# Patient Record
Sex: Male | Born: 1960
Health system: Southern US, Community
[De-identification: ages and names within clinical notes are randomized; demographics above are authoritative.]

## PROBLEM LIST (undated history)

## (undated) DIAGNOSIS — F419 Anxiety disorder, unspecified: Secondary | ICD-10-CM

## (undated) DIAGNOSIS — R0600 Dyspnea, unspecified: Secondary | ICD-10-CM

## (undated) DIAGNOSIS — G8928 Other chronic postprocedural pain: Secondary | ICD-10-CM

## (undated) DIAGNOSIS — J189 Pneumonia, unspecified organism: Secondary | ICD-10-CM

## (undated) DIAGNOSIS — F32A Depression, unspecified: Secondary | ICD-10-CM

## (undated) DIAGNOSIS — R569 Unspecified convulsions: Secondary | ICD-10-CM

## (undated) DIAGNOSIS — M1990 Unspecified osteoarthritis, unspecified site: Secondary | ICD-10-CM

## (undated) DIAGNOSIS — I2510 Atherosclerotic heart disease of native coronary artery without angina pectoris: Secondary | ICD-10-CM

## (undated) DIAGNOSIS — J449 Chronic obstructive pulmonary disease, unspecified: Secondary | ICD-10-CM

## (undated) HISTORY — DX: Other chronic postprocedural pain: G89.28

## (undated) HISTORY — DX: Chronic obstructive pulmonary disease, unspecified: J44.9

## (undated) HISTORY — PX: OTHER SURGICAL HISTORY: SHX169

## (undated) HISTORY — DX: Unspecified osteoarthritis, unspecified site: M19.90

## (undated) HISTORY — PX: ANKLE SURGERY: SHX546

## (undated) HISTORY — PX: NASAL FRACTURE SURGERY: SHX718

## (undated) HISTORY — DX: Unspecified convulsions: R56.9

## (undated) HISTORY — PX: COLONOSCOPY W/ POLYPECTOMY: SHX1380

## (undated) HISTORY — PX: VASECTOMY: SHX75

---

## 1997-12-24 ENCOUNTER — Emergency Department (HOSPITAL_COMMUNITY): Admission: EM | Admit: 1997-12-24 | Discharge: 1997-12-24 | Payer: Self-pay | Admitting: Emergency Medicine

## 1997-12-25 ENCOUNTER — Encounter: Payer: Self-pay | Admitting: Emergency Medicine

## 1998-01-03 ENCOUNTER — Inpatient Hospital Stay (HOSPITAL_COMMUNITY): Admission: EM | Admit: 1998-01-03 | Discharge: 1998-01-07 | Payer: Self-pay | Admitting: Emergency Medicine

## 1999-05-12 ENCOUNTER — Encounter: Payer: Self-pay | Admitting: Emergency Medicine

## 1999-05-12 ENCOUNTER — Emergency Department (HOSPITAL_COMMUNITY): Admission: EM | Admit: 1999-05-12 | Discharge: 1999-05-12 | Payer: Self-pay | Admitting: Emergency Medicine

## 1999-07-16 ENCOUNTER — Ambulatory Visit (HOSPITAL_COMMUNITY): Admission: RE | Admit: 1999-07-16 | Discharge: 1999-07-16 | Payer: Self-pay | Admitting: Gastroenterology

## 1999-12-21 ENCOUNTER — Emergency Department (HOSPITAL_COMMUNITY): Admission: EM | Admit: 1999-12-21 | Discharge: 1999-12-21 | Payer: Self-pay

## 2000-01-20 ENCOUNTER — Encounter: Payer: Self-pay | Admitting: Orthopedic Surgery

## 2000-01-29 ENCOUNTER — Ambulatory Visit (HOSPITAL_COMMUNITY): Admission: RE | Admit: 2000-01-29 | Discharge: 2000-01-29 | Payer: Self-pay | Admitting: Orthopedic Surgery

## 2004-05-02 ENCOUNTER — Ambulatory Visit: Payer: Self-pay | Admitting: Family Medicine

## 2004-12-22 ENCOUNTER — Ambulatory Visit: Payer: Self-pay | Admitting: Pain Medicine

## 2005-06-09 ENCOUNTER — Ambulatory Visit: Payer: Self-pay | Admitting: Urology

## 2006-04-14 ENCOUNTER — Emergency Department: Payer: Self-pay | Admitting: Emergency Medicine

## 2010-01-23 ENCOUNTER — Emergency Department: Payer: Self-pay | Admitting: Emergency Medicine

## 2010-05-01 ENCOUNTER — Ambulatory Visit: Payer: Self-pay | Admitting: Internal Medicine

## 2010-08-03 ENCOUNTER — Other Ambulatory Visit: Payer: Self-pay | Admitting: Neurosurgery

## 2010-08-03 DIAGNOSIS — M549 Dorsalgia, unspecified: Secondary | ICD-10-CM

## 2010-08-03 DIAGNOSIS — M542 Cervicalgia: Secondary | ICD-10-CM

## 2010-08-14 ENCOUNTER — Other Ambulatory Visit: Payer: Self-pay

## 2010-10-08 ENCOUNTER — Encounter: Payer: Self-pay | Admitting: Internal Medicine

## 2010-10-23 ENCOUNTER — Other Ambulatory Visit: Payer: Self-pay | Admitting: *Deleted

## 2010-10-23 NOTE — Telephone Encounter (Signed)
Received refill request from [facility] for prednisone 10mg  6 day pack. Attemped to reach pt at number listed in system [phone] and was told I had the wrong number. Number was deleted from record. Spoke to [place] at [place] and advised her to have pt call our office as refill is denied. Pt may need appt if he needs refill of dosepak.

## 2010-10-27 ENCOUNTER — Telehealth: Payer: Self-pay | Admitting: Internal Medicine

## 2010-10-30 NOTE — Telephone Encounter (Signed)
Opened in error

## 2010-10-31 ENCOUNTER — Other Ambulatory Visit: Payer: Self-pay | Admitting: Internal Medicine

## 2010-10-31 NOTE — Telephone Encounter (Signed)
Opened in error

## 2010-11-11 ENCOUNTER — Telehealth: Payer: Self-pay | Admitting: *Deleted

## 2010-11-11 NOTE — Telephone Encounter (Signed)
Patient called stating that he needs you to call him in some Prednisone for his back and neck. Patient states that he is having a flare up with his back and he was told to call when he needed a refill on Prednisone. Patient stated that he has been treated with this in the past.. Patient stated that this has been going on for 2-3 weeks.  Patient was informed that Dr. Dan Humphreys is out of the office this afternoon. Patient stated that it would be okay for message to wait.  Pharmacy-[facility]/[address].

## 2010-11-12 NOTE — Telephone Encounter (Signed)
Before I call him in meds, he should be seen.

## 2010-11-13 NOTE — Telephone Encounter (Signed)
Patient notified. He will call back for appt.

## 2010-12-18 ENCOUNTER — Ambulatory Visit (INDEPENDENT_AMBULATORY_CARE_PROVIDER_SITE_OTHER): Payer: BC Managed Care – PPO | Admitting: Internal Medicine

## 2010-12-18 ENCOUNTER — Encounter: Payer: Self-pay | Admitting: Internal Medicine

## 2010-12-18 VITALS — BP 132/86 | HR 71 | Temp 98.2°F | Resp 16 | Ht 72.0 in | Wt 169.0 lb

## 2010-12-18 DIAGNOSIS — J40 Bronchitis, not specified as acute or chronic: Secondary | ICD-10-CM

## 2010-12-18 DIAGNOSIS — G894 Chronic pain syndrome: Secondary | ICD-10-CM | POA: Insufficient documentation

## 2010-12-18 DIAGNOSIS — E291 Testicular hypofunction: Secondary | ICD-10-CM

## 2010-12-18 DIAGNOSIS — G8929 Other chronic pain: Secondary | ICD-10-CM | POA: Insufficient documentation

## 2010-12-18 MED ORDER — CYCLOBENZAPRINE HCL 5 MG PO TABS: 5.0000 mg | ORAL_TABLET | Freq: Three times a day (TID) | ORAL | Status: DC | PRN

## 2010-12-18 MED ORDER — TESTOSTERONE CYPIONATE 200 MG/ML IM SOLN: 80.0000 mg | INTRAMUSCULAR | Status: DC

## 2010-12-18 MED ORDER — AZITHROMYCIN 250 MG PO TABS: ORAL_TABLET | ORAL | Status: AC

## 2010-12-18 MED ORDER — GUAIFENESIN-CODEINE 100-10 MG/5ML PO SYRP: 5.0000 mL | ORAL_SOLUTION | Freq: Three times a day (TID) | ORAL | Status: AC | PRN

## 2010-12-18 MED ORDER — ALFUZOSIN HCL ER 10 MG PO TB24: 10.0000 mg | ORAL_TABLET | Freq: Every day | ORAL | Status: DC

## 2010-12-18 NOTE — Progress Notes (Signed)
Subjective:    Patient ID: Jared Farley, male    DOB: 06/20/1960, 50 y.o.   MRN: [id number]  HPI Jared Farley is a 50 year old male with a history of chronic pain and degenerative disease of the cervical and lumbar spine who presents for followup. His primary concern today is a recent episode of cough. He reports this began approximately 2 weeks ago. He notes that several coworkers have been ill with similar symptoms. He describes a dry hacking cough which persisted most of the day. He notes some per human nasal drainage. He reports some chills but no fever. He denies any sore throat. He denies any shortness of breath or wheezing. He has been using over-the-counter cold medications with no improvement.  In regard to his chronic pain, he reports that his pain management physician recently changed his dose of morphine secondary to worsening pain. He notes some improvement with this. He continues to have chronic testicular pain as well as pain in his cervical and lumbar spine. At this point, he has opted not to pursue surgical intervention to his spine to help with pain.  Outpatient Encounter Prescriptions as of 12/18/2010  Medication Sig Dispense Refill  . alfuzosin (UROXATRAL) 10 MG 24 hr tablet Take 1 tablet (10 mg total) by mouth daily.  30 tablet  6  . cyclobenzaprine (FLEXERIL) 5 MG tablet Take 1 tablet (5 mg total) by mouth 3 (three) times daily as needed.  90 tablet  4  . HYDROcodone-acetaminophen (LORTAB) 10-500 MG per tablet Take 1 tablet by mouth every 6 (six) hours as needed.        Marland Kitchen morphine (AVINZA) 60 MG 24 hr capsule Take 60 mg by mouth 2 (two) times daily.        Marland Kitchen testosterone cypionate (DEPOTESTOTERONE CYPIONATE) 200 MG/ML injection Inject 0.4 mLs (80 mg total) into the muscle once a week. 1/4 ml every week.   10 mL  4    Review of Systems  Constitutional: Positive for chills. Negative for fever, activity change, appetite change, fatigue and unexpected weight change.  HENT:  Positive for congestion, neck pain and postnasal drip. Negative for ear pain, sore throat, trouble swallowing and ear discharge.   Eyes: Negative for visual disturbance.  Respiratory: Positive for cough. Negative for shortness of breath.   Cardiovascular: Negative for chest pain, palpitations and leg swelling.  Gastrointestinal: Negative for abdominal pain and abdominal distention.  Genitourinary: Negative for dysuria, urgency and difficulty urinating.  Musculoskeletal: Positive for myalgias, back pain and arthralgias. Negative for gait problem.  Skin: Negative for color change and rash.  Hematological: Negative for adenopathy.  Psychiatric/Behavioral: Negative for sleep disturbance and dysphoric mood. The patient is not nervous/anxious.    BP 132/86  Pulse 71  Temp(Src) 98.2 F (36.8 C) (Oral)  Resp 16  Ht 6' (1.829 m)  Wt 169 lb (76.658 kg)  BMI 22.92 kg/m2  SpO2 98%     Objective:   Physical Exam  Constitutional: He is oriented to person, place, and time. He appears well-developed and well-nourished. No distress.  HENT:  Head: Normocephalic and atraumatic.  Right Ear: External ear normal.  Left Ear: External ear normal.  Nose: Nose normal.  Mouth/Throat: Oropharynx is clear and moist. No oropharyngeal exudate.  Eyes: Conjunctivae and EOM are normal. Pupils are equal, round, and reactive to light. Right eye exhibits no discharge. Left eye exhibits no discharge. No scleral icterus.  Neck: Normal range of motion. Neck supple. No tracheal deviation present. No thyromegaly  present.  Cardiovascular: Normal rate, regular rhythm and normal heart sounds.  Exam reveals no gallop and no friction rub.   No murmur heard. Pulmonary/Chest: Effort normal. No accessory muscle usage. Not tachypneic. No respiratory distress. He has no wheezes. He has rhonchi in the right middle field and the left middle field. He has no rales. He exhibits no tenderness.  Musculoskeletal: Normal range of motion.  He exhibits no edema.  Lymphadenopathy:    He has no cervical adenopathy.  Neurological: He is alert and oriented to person, place, and time. No cranial nerve deficit. Coordination normal.  Skin: Skin is warm and dry. No rash noted. He is not diaphoretic. No erythema. No pallor.  Psychiatric: He has a normal mood and affect. His behavior is normal. Judgment and thought content normal.          Assessment & Plan:  1. Bronchitis -patients symptoms and exam are most consistent with bronchitis. Will treat with azithromycin and codeine for cough. He has no wheezing or prolonged expiration on exam to suggest need for prednisone. He is a smoker. Encourage smoking cessation. He has tried using both Chantix and Wellbutrin to help with smoking cessation, but side effects have been prohibitive. Should his symptoms worsen or not improve over the next 48 hours he will call or return to clinic.  2. Hypogonadism-refill given on testosterone today. He is followed by Dr. Achilles Dunk in urology.  3. 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 He also has chronic pain in his back secondary to degenerative disease with nerve compression. He has been evaluated by neurosurgery but has opted not to pursue surgical intervention at this time given the potential risk and uncertainty about benefit. He is followed at the pain clinic and will continue to obtain narcotic prescriptions through them.

## 2010-12-23 ENCOUNTER — Ambulatory Visit (INDEPENDENT_AMBULATORY_CARE_PROVIDER_SITE_OTHER): Payer: BC Managed Care – PPO | Admitting: Internal Medicine

## 2010-12-23 ENCOUNTER — Encounter: Payer: Self-pay | Admitting: Internal Medicine

## 2010-12-23 ENCOUNTER — Ambulatory Visit (INDEPENDENT_AMBULATORY_CARE_PROVIDER_SITE_OTHER)
Admission: RE | Admit: 2010-12-23 | Discharge: 2010-12-23 | Disposition: A | Discharge status: Home / Self Care | Payer: BC Managed Care – PPO | Source: Ambulatory Visit | Attending: Internal Medicine | Admitting: Internal Medicine

## 2010-12-23 VITALS — BP 118/72 | HR 81

## 2010-12-23 DIAGNOSIS — J40 Bronchitis, not specified as acute or chronic: Secondary | ICD-10-CM

## 2010-12-23 MED ORDER — PREDNISONE (PAK) 10 MG PO TABS: 10.0000 mg | ORAL_TABLET | Freq: Every day | ORAL | Status: DC

## 2010-12-23 MED ORDER — LEVOFLOXACIN 750 MG PO TABS: 750.0000 mg | ORAL_TABLET | Freq: Every day | ORAL | Status: DC

## 2010-12-23 MED ORDER — ALBUTEROL SULFATE HFA 108 (90 BASE) MCG/ACT IN AERS: 2.0000 | INHALATION_SPRAY | Freq: Four times a day (QID) | RESPIRATORY_TRACT | Status: DC | PRN

## 2010-12-23 NOTE — Progress Notes (Signed)
Subjective:    Patient ID: Jared Farley, male    DOB: 1960/10/04, 50 y.o.   MRN: [id number]  HPI 50 year old male with a history of smoking presents for followup of cough. He was seen last week with early onset of cough and started on azithromycin. He reports he completed 4/5 doses of azithromycin. His symptoms have gradually worsened. He has developed shortness of breath, wheezing, and worsening cough with some chest tightness and pleuritic chest pain. He reports chills but denies fever. He has been using over-the-counter cold remedies as well as codeine for cough with no improvement.  Outpatient Encounter Prescriptions as of 12/23/2010  Medication Sig Dispense Refill  . albuterol (PROAIR HFA) 108 (90 BASE) MCG/ACT inhaler Inhale 2 puffs into the lungs every 6 (six) hours as needed for wheezing.  18 g  0  . alfuzosin (UROXATRAL) 10 MG 24 hr tablet Take 1 tablet (10 mg total) by mouth daily.  30 tablet  6  . azithromycin (ZITHROMAX) 250 MG tablet Take 2 tablets today, then 1 tablet daily days 2-4  6 each  0  . cyclobenzaprine (FLEXERIL) 5 MG tablet Take 1 tablet (5 mg total) by mouth 3 (three) times daily as needed.  90 tablet  4  . guaiFENesin-codeine (GUAIFENESIN AC) 100-10 MG/5ML syrup Take 5 mLs by mouth 3 (three) times daily as needed for cough.  120 mL  0  . HYDROcodone-acetaminophen (LORTAB) 10-500 MG per tablet Take 1 tablet by mouth every 6 (six) hours as needed.        Marland Kitchen levofloxacin (LEVAQUIN) 750 MG tablet Take 1 tablet (750 mg total) by mouth daily.  10 tablet  0  . morphine (AVINZA) 60 MG 24 hr capsule Take 60 mg by mouth 2 (two) times daily.        . predniSONE (STERAPRED UNI-PAK) 10 MG tablet Take 1 tablet (10 mg total) by mouth daily. Take 60mg  prednisone today, then taper by 10mg  daily  21 tablet  0  . testosterone cypionate (DEPOTESTOTERONE CYPIONATE) 200 MG/ML injection Inject 0.4 mLs (80 mg total) into the muscle every 14 (fourteen) days. 1/4 ml every week.   10 mL  4  .  testosterone cypionate (DEPOTESTOTERONE CYPIONATE) 200 MG/ML injection Inject 0.4 mLs (80 mg total) into the muscle once a week. 1/4 ml every week.   10 mL  4    Review of Systems  Constitutional: Positive for chills and fatigue. Negative for fever and unexpected weight change.  HENT: Positive for congestion.   Eyes: Negative for visual disturbance.  Respiratory: Positive for cough, chest tightness, shortness of breath and wheezing.   Cardiovascular: Positive for chest pain. Negative for palpitations and leg swelling.  Gastrointestinal: Negative for abdominal pain and abdominal distention.  Genitourinary: Positive for testicular pain (chronic).  Musculoskeletal: Positive for myalgias, back pain and arthralgias. Negative for gait problem.  Skin: Negative for color change and rash.  Hematological: Negative for adenopathy.  Psychiatric/Behavioral: Negative for sleep disturbance and dysphoric mood. The patient is not nervous/anxious.    BP 118/72  Pulse 81  SpO2 98%     Objective:   Physical Exam  Constitutional: He is oriented to person, place, and time. He appears well-developed and well-nourished. No distress.  HENT:  Head: Normocephalic and atraumatic.  Right Ear: External ear normal.  Left Ear: External ear normal.  Nose: Nose normal.  Mouth/Throat: Oropharynx is clear and moist. No oropharyngeal exudate.  Eyes: Conjunctivae and EOM are normal. Pupils are equal, round, and  reactive to light. Right eye exhibits no discharge. Left eye exhibits no discharge. No scleral icterus.  Neck: Normal range of motion. Neck supple. No tracheal deviation present. No thyromegaly present.  Cardiovascular: Normal rate, regular rhythm and normal heart sounds.  Exam reveals no gallop and no friction rub.   No murmur heard. Pulmonary/Chest: Effort normal. No accessory muscle usage. Not tachypneic. No respiratory distress. He has no decreased breath sounds. He has wheezes. He has rhonchi. He has no  rales. He exhibits no tenderness.  Musculoskeletal: Normal range of motion. He exhibits no edema.  Lymphadenopathy:    He has no cervical adenopathy.  Neurological: He is alert and oriented to person, place, and time. No cranial nerve deficit. Coordination normal.  Skin: Skin is warm and dry. No rash noted. He is not diaphoretic. No erythema. No pallor.  Psychiatric: He has a normal mood and affect. His behavior is normal. Judgment and thought content normal.          Assessment & Plan:  1. Bronchitis - symptoms and exam have worsened considerably compared with last week. However oxygen sat is normal and pt is not tachypneic. Suspect worsening symptoms due to underlying COPD exacerbation. Will get chest x-ray today. Will change antibiotics to Levaquin and will add prednisone taper pack. Will also add inhaled albuterol. Patient will continue to use codeine for cough. He will call or return to clinic if symptoms are not improving over the next 24-48 hours. Otherwise, he will followup in one week for recheck.

## 2011-01-01 ENCOUNTER — Ambulatory Visit (INDEPENDENT_AMBULATORY_CARE_PROVIDER_SITE_OTHER): Payer: BC Managed Care – PPO | Admitting: Internal Medicine

## 2011-01-01 ENCOUNTER — Encounter: Payer: Self-pay | Admitting: Internal Medicine

## 2011-01-01 VITALS — BP 120/79 | HR 82 | Temp 98.4°F | Wt 172.0 lb

## 2011-01-01 DIAGNOSIS — J40 Bronchitis, not specified as acute or chronic: Secondary | ICD-10-CM

## 2011-01-01 MED ORDER — PREDNISONE (PAK) 10 MG PO TABS: 10.0000 mg | ORAL_TABLET | Freq: Every day | ORAL | Status: AC

## 2011-01-01 MED ORDER — BUDESONIDE-FORMOTEROL FUMARATE 80-4.5 MCG/ACT IN AERO: 2.0000 | INHALATION_SPRAY | Freq: Two times a day (BID) | RESPIRATORY_TRACT | Status: DC

## 2011-01-01 NOTE — Progress Notes (Signed)
Subjective:    Patient ID: Jared Farley, male    DOB: 1960/07/08, 50 y.o.   MRN: [id number]  HPI  50 year old male with a history of tobacco use and a recent episode of bronchitis presents for followup. He reports that his cough and shortness of breath have improved with the use of prednisone taper. However, he continues to have some wheezing. He has been using his albuterol inhaler as well as Mucinex with some improvement. He denies any fever or chills. He denies any new complaints today.   Outpatient Encounter Prescriptions as of 01/01/2011  Medication Sig Dispense Refill  . albuterol (PROAIR HFA) 108 (90 BASE) MCG/ACT inhaler Inhale 2 puffs into the lungs every 6 (six) hours as needed for wheezing.  18 g  0  . alfuzosin (UROXATRAL) 10 MG 24 hr tablet Take 1 tablet (10 mg total) by mouth daily.  30 tablet  6  . cyclobenzaprine (FLEXERIL) 5 MG tablet Take 1 tablet (5 mg total) by mouth 3 (three) times daily as needed.  90 tablet  4  . HYDROcodone-acetaminophen (LORTAB) 10-500 MG per tablet Take 1 tablet by mouth every 6 (six) hours as needed.        Marland Kitchen morphine (AVINZA) 60 MG 24 hr capsule Take 60 mg by mouth 2 (two) times daily.        Marland Kitchen testosterone cypionate (DEPOTESTOTERONE CYPIONATE) 200 MG/ML injection Inject 0.4 mLs (80 mg total) into the muscle every 14 (fourteen) days. 1/4 ml every week.   10 mL  4  . testosterone cypionate (DEPOTESTOTERONE CYPIONATE) 200 MG/ML injection Inject 0.4 mLs (80 mg total) into the muscle once a week. 1/4 ml every week.   10 mL  4    Review of Systems  Constitutional: Positive for fatigue. Negative for fever, chills and diaphoresis.  HENT: Negative for ear pain, congestion, rhinorrhea and sinus pressure.   Respiratory: Positive for cough, chest tightness, shortness of breath and wheezing.   Cardiovascular: Positive for chest pain. Negative for palpitations.  Musculoskeletal: Positive for myalgias, back pain and arthralgias.   BP 120/79  Pulse 82   Temp(Src) 98.4 F (36.9 C) (Oral)  Wt 172 lb (78.019 kg)  SpO2 98%     Objective:   Physical Exam  Constitutional: He is oriented to person, place, and time. He appears well-developed and well-nourished. No distress.  HENT:  Head: Normocephalic and atraumatic.  Right Ear: External ear normal.  Left Ear: External ear normal.  Nose: Nose normal.  Mouth/Throat: Oropharynx is clear and moist. No oropharyngeal exudate.  Eyes: Conjunctivae and EOM are normal. Pupils are equal, round, and reactive to light. Right eye exhibits no discharge. Left eye exhibits no discharge. No scleral icterus.  Neck: Normal range of motion. Neck supple. No tracheal deviation present. No thyromegaly present.  Cardiovascular: Normal rate, regular rhythm and normal heart sounds.  Exam reveals no gallop and no friction rub.   No murmur heard. Pulmonary/Chest: Effort normal. No accessory muscle usage. Not tachypneic. No respiratory distress. He has no decreased breath sounds. He has wheezes. He has no rales. He exhibits no tenderness.  Musculoskeletal: Normal range of motion. He exhibits no edema.  Lymphadenopathy:    He has no cervical adenopathy.  Neurological: He is alert and oriented to person, place, and time. No cranial nerve deficit. Coordination normal.  Skin: Skin is warm and dry. No rash noted. He is not diaphoretic. No erythema. No pallor.  Psychiatric: He has a normal mood and affect. His behavior  is normal. Judgment and thought content normal.          Assessment & Plan:  1. Bronchitis -symptoms are improving. On exam he continues to have some wheezing and prolonged expiratory phase. Given his ongoing tobacco use, and recurrent episodes of bronchitis, he likely has chronic obstruction and I think he would benefit from the use of long-acting bronchodilator and inhaled steroid. We'll start Symbicort. We'll also repeat a prednisone taper. He will followup in one month or sooner if symptoms are  worsening.

## 2011-01-01 NOTE — Patient Instructions (Signed)
Start Symbicort. Call if any problems. Follow up in 1 month.

## 2011-01-05 ENCOUNTER — Telehealth: Payer: Self-pay | Admitting: Internal Medicine

## 2011-01-05 NOTE — Telephone Encounter (Signed)
Pt left Vm - he will be seeing Dr Alycia Rossetti(?) his urologist tomorrow at 9 am for eval

## 2011-01-05 NOTE — Telephone Encounter (Signed)
Left mess to call office back.   

## 2011-01-05 NOTE — Telephone Encounter (Signed)
Patient had a hemorrhoid banding done at Dr. Jennye Boroughs office ,Dr. Kinnie Scales is out of the country and he was told to call his primary dr. The patient is having trouble urinating.

## 2011-01-08 ENCOUNTER — Encounter: Payer: Self-pay | Admitting: Internal Medicine

## 2011-02-05 ENCOUNTER — Ambulatory Visit: Payer: BC Managed Care – PPO | Admitting: Internal Medicine

## 2011-02-25 ENCOUNTER — Ambulatory Visit: Payer: BC Managed Care – PPO | Admitting: Internal Medicine

## 2011-02-26 ENCOUNTER — Ambulatory Visit (INDEPENDENT_AMBULATORY_CARE_PROVIDER_SITE_OTHER): Payer: BC Managed Care – PPO | Admitting: Internal Medicine

## 2011-02-26 ENCOUNTER — Encounter: Payer: Self-pay | Admitting: Internal Medicine

## 2011-02-26 VITALS — BP 120/68 | HR 68 | Temp 98.2°F | Wt 169.0 lb

## 2011-02-26 DIAGNOSIS — J40 Bronchitis, not specified as acute or chronic: Secondary | ICD-10-CM

## 2011-02-26 MED ORDER — AZITHROMYCIN 250 MG PO TABS: ORAL_TABLET | ORAL | Status: AC

## 2011-02-26 MED ORDER — PREDNISONE (PAK) 10 MG PO TABS: ORAL_TABLET | ORAL | Status: AC

## 2011-02-26 MED ORDER — GUAIFENESIN-CODEINE 100-10 MG/5ML PO SYRP: 5.0000 mL | ORAL_SOLUTION | Freq: Two times a day (BID) | ORAL | Status: AC | PRN

## 2011-02-26 NOTE — Progress Notes (Signed)
Subjective:    Patient ID: Jared Farley, male    DOB: 1960-12-29, 50 y.o.   MRN: [id number]  HPI 50 year old male with a history of tobacco use and recurrent bronchitis presents for an acute visit complaining of a four-day history of cough productive of purulent sputum, shortness of breath, wheezing, and nasal congestion. He is also noted fever and chills. He has been using his albuterol inhaler and over-the-counter cough and cold preparations with no improvement in his symptoms. He reports numerous sick contacts at his work. He denies chest pain, myalgia.  Outpatient Encounter Prescriptions as of 02/26/2011  Medication Sig Dispense Refill  . albuterol (PROAIR HFA) 108 (90 BASE) MCG/ACT inhaler Inhale 2 puffs into the lungs every 6 (six) hours as needed for wheezing.  18 g  0  . alfuzosin (UROXATRAL) 10 MG 24 hr tablet Take 1 tablet (10 mg total) by mouth daily.  30 tablet  6  . budesonide-formoterol (SYMBICORT) 80-4.5 MCG/ACT inhaler Inhale 2 puffs into the lungs 2 (two) times daily.  1 Inhaler  12  . carisoprodol (SOMA) 350 MG tablet Take 350 mg by mouth at bedtime as needed.        Marland Kitchen HYDROcodone-acetaminophen (LORTAB) 10-500 MG per tablet Take 1 tablet by mouth every 6 (six) hours as needed.        Marland Kitchen morphine (AVINZA) 60 MG 24 hr capsule Take 60 mg by mouth 2 (two) times daily.        Marland Kitchen testosterone cypionate (DEPOTESTOTERONE CYPIONATE) 200 MG/ML injection Inject 0.4 mLs (80 mg total) into the muscle once a week. 1/4 ml every week.   10 mL  4    Review of Systems  Constitutional: Positive for fever and chills. Negative for activity change and fatigue.  HENT: Positive for congestion and postnasal drip. Negative for hearing loss, ear pain, nosebleeds, sore throat, rhinorrhea, sneezing, trouble swallowing, neck pain, neck stiffness, voice change, sinus pressure, tinnitus and ear discharge.   Eyes: Negative for discharge, redness, itching and visual disturbance.  Respiratory: Positive for  cough, shortness of breath and wheezing. Negative for chest tightness and stridor.   Cardiovascular: Negative for chest pain and leg swelling.  Musculoskeletal: Negative for myalgias and arthralgias.  Skin: Negative for color change and rash.  Neurological: Negative for dizziness, facial asymmetry and headaches.  Psychiatric/Behavioral: Negative for sleep disturbance.   BP 120/68  Pulse 68  Temp(Src) 98.2 F (36.8 C) (Oral)  Wt 169 lb (76.658 kg)  SpO2 95%     Objective:   Physical Exam  Constitutional: He is oriented to person, place, and time. He appears well-developed and well-nourished. No distress.  HENT:  Head: Normocephalic and atraumatic.  Right Ear: External ear normal.  Left Ear: External ear normal.  Nose: Nose normal.  Mouth/Throat: Oropharynx is clear and moist. No oropharyngeal exudate.  Eyes: Conjunctivae and EOM are normal. Pupils are equal, round, and reactive to light. Right eye exhibits no discharge. Left eye exhibits no discharge. No scleral icterus.  Neck: Normal range of motion. Neck supple. No tracheal deviation present. No thyromegaly present.  Cardiovascular: Normal rate, regular rhythm and normal heart sounds.  Exam reveals no gallop and no friction rub.   No murmur heard. Pulmonary/Chest: Effort normal. No accessory muscle usage. Not tachypneic. No respiratory distress. He has decreased breath sounds (porlonged expiratory phase). He has no wheezes. He has no rales. He exhibits no tenderness.  Musculoskeletal: Normal range of motion. He exhibits no edema.  Lymphadenopathy:  He has no cervical adenopathy.  Neurological: He is alert and oriented to person, place, and time. No cranial nerve deficit. Coordination normal.  Skin: Skin is warm and dry. No rash noted. He is not diaphoretic. No erythema. No pallor.  Psychiatric: He has a normal mood and affect. His behavior is normal. Judgment and thought content normal.          Assessment & Plan:  1.  Bronchitis -symptoms and exam are most consistent with bronchitis. Will treat with azithromycin and prednisone taper. He will continue to use albuterol as needed for shortness of breath. He will use codeine as needed for cough. He will call or return to clinic if symptoms are not improving within the next 48-72 hours.

## 2012-02-05 ENCOUNTER — Other Ambulatory Visit: Payer: Self-pay | Admitting: Internal Medicine

## 2012-02-10 ENCOUNTER — Other Ambulatory Visit: Payer: Self-pay | Admitting: Internal Medicine

## 2012-02-10 NOTE — Telephone Encounter (Signed)
Refill request for Testosterone Pt has not been seen since 02/26/11. Med last filled on 12/18/10. Ok to refill?

## 2012-02-25 ENCOUNTER — Telehealth: Payer: Self-pay | Admitting: General Practice

## 2012-02-25 NOTE — Telephone Encounter (Signed)
Pt called wanting his testosterone filled at medical [facility]. Pt has not been seen by Dr. Dan Humphreys since 02/26/11. Notified pt that he would need an appt or lab work completed. Offered to make pt an appt advised that he could not pay at this time and hung up.

## 2012-04-14 ENCOUNTER — Ambulatory Visit: Payer: BC Managed Care – PPO | Admitting: Internal Medicine

## 2012-05-04 ENCOUNTER — Ambulatory Visit (INDEPENDENT_AMBULATORY_CARE_PROVIDER_SITE_OTHER): Payer: Self-pay | Admitting: Internal Medicine

## 2012-05-04 ENCOUNTER — Encounter: Payer: Self-pay | Admitting: Internal Medicine

## 2012-05-04 VITALS — BP 120/74 | HR 92 | Temp 98.2°F | Resp 16 | Wt 182.0 lb

## 2012-05-04 DIAGNOSIS — E291 Testicular hypofunction: Secondary | ICD-10-CM

## 2012-05-04 DIAGNOSIS — R6889 Other general symptoms and signs: Secondary | ICD-10-CM

## 2012-05-04 DIAGNOSIS — R300 Dysuria: Secondary | ICD-10-CM

## 2012-05-04 DIAGNOSIS — G8929 Other chronic pain: Secondary | ICD-10-CM

## 2012-05-04 DIAGNOSIS — J440 Chronic obstructive pulmonary disease with acute lower respiratory infection: Secondary | ICD-10-CM | POA: Insufficient documentation

## 2012-05-04 LAB — COMPREHENSIVE METABOLIC PANEL
Albumin: 3.9 g/dL (ref 3.5–5.2)
BUN: 12 mg/dL (ref 6–23)
CO2: 32 mEq/L (ref 19–32)
Calcium: 9.2 mg/dL (ref 8.4–10.5)
Chloride: 98 mEq/L (ref 96–112)
Creatinine, Ser: 1 mg/dL (ref 0.4–1.5)
GFR: 86.67 mL/min (ref >60.00)
Glucose, Bld: 98 mg/dL (ref 70–99)

## 2012-05-04 LAB — CBC WITH DIFFERENTIAL/PLATELET
Basophils Absolute: 0 10*3/uL (ref 0.0–0.1)
Basophils Relative: 0.2 % (ref 0.0–3.0)
Hemoglobin: 13.9 g/dL (ref 13.0–17.0)
Lymphocytes Relative: 30.8 % (ref 12.0–46.0)
Monocytes Relative: 10 % (ref 3.0–12.0)
Neutro Abs: 6 10*3/uL (ref 1.4–7.7)
Neutrophils Relative %: 57.7 % (ref 43.0–77.0)
RBC: 4.24 Mil/uL (ref 4.22–5.81)
WBC: 10.4 10*3/uL (ref 4.5–10.5)

## 2012-05-04 LAB — POCT URINALYSIS DIPSTICK
Glucose, UA: NEGATIVE
Nitrite, UA: NEGATIVE
Protein, UA: NEGATIVE
Urobilinogen, UA: 0.2
pH, UA: 5.5

## 2012-05-04 LAB — VITAMIN B12: Vitamin B-12: 451 pg/mL (ref 211–911)

## 2012-05-04 MED ORDER — LEVOFLOXACIN 750 MG PO TABS: 750.0000 mg | ORAL_TABLET | Freq: Every day | ORAL | Status: AC

## 2012-05-04 NOTE — Assessment & Plan Note (Signed)
Recent temperature intolerance. Will check TSH, CBC with labs today.

## 2012-05-04 NOTE — Assessment & Plan Note (Signed)
Some pain with urination noted. Chronic back pain unchanged.  Urinalysis pos for blood. Will send for culture. Discussed that Levaquin may not provide good coverage of UTI. Discussed potentially adding second antibiotic depending on culture results. If culture negative, will need to set up urology evaluation and non-contrast CT abdomen to look for nephrolithiasis.

## 2012-05-04 NOTE — Progress Notes (Signed)
Subjective:    Patient ID: Jared Farley, male    DOB: 1961-02-01, 52 y.o.   MRN: [id number]  HPI 52 year old male with history of COPD and tobacco abuse presents for acute visit complaining of one-week history of shortness of breath, cough productive of purulent sputum , and general malaise. He has been taking a prednisone taper which was initially prescribed for back pain. He has had no improvement with this. He reports he has been out of his inhalers. He lost his insurance coverage and has not recently been seen.  In regards to to chronic back pain, he reports he is followed by local pain clinic. He continues on morphine and hydrocodone. He reports poor control with this.   In regards to hypogonadism, he reports he has been off and on testosterone because he could not afford this medication. He would like to recheck testosterone level today. When off testosterone, he notes that his depression is significantly worsened when he is off the testosterone supplements. He notes decreased energy as well. He understands risk of testosterone supplementation including potential increased risk of heart disease.  Outpatient Encounter Prescriptions as of 05/04/2012  Medication Sig Dispense Refill  . albuterol (PROAIR HFA) 108 (90 BASE) MCG/ACT inhaler Inhale 2 puffs into the lungs every 6 (six) hours as needed for wheezing.  18 g  0  . alfuzosin (UROXATRAL) 10 MG 24 hr tablet Take 1 tablet (10 mg total) by mouth daily.  30 tablet  6  . budesonide-formoterol (SYMBICORT) 80-4.5 MCG/ACT inhaler Inhale 2 puffs into the lungs 2 (two) times daily.  1 Inhaler  12  . carisoprodol (SOMA) 350 MG tablet Take 350 mg by mouth at bedtime as needed.        Marland Kitchen HYDROcodone-acetaminophen (LORTAB) 10-500 MG per tablet Take 1 tablet by mouth every 6 (six) hours as needed.        Marland Kitchen morphine (AVINZA) 60 MG 24 hr capsule Take 60 mg by mouth 2 (two) times daily.        . predniSONE (DELTASONE) 10 MG tablet Take 10 mg by mouth daily.  Prednisone taper, tapering down day by day      . testosterone cypionate (DEPOTESTOTERONE CYPIONATE) 200 MG/ML injection Inject 0.4 mLs (80 mg total) into the muscle once a week. 1/4 ml every week.   10 mL  4  . levofloxacin (LEVAQUIN) 750 MG tablet Take 1 tablet (750 mg total) by mouth daily.  7 tablet  0   No facility-administered encounter medications on file as of 05/04/2012.   BP 120/74  Pulse 92  Temp(Src) 98.2 F (36.8 C) (Oral)  Resp 16  Wt 182 lb (82.555 kg)  BMI 24.68 kg/m2  SpO2 95%  Review of Systems  Constitutional: Negative for fever, chills, activity change, appetite change, fatigue and unexpected weight change.  Eyes: Negative for visual disturbance.  Respiratory: Negative for cough and shortness of breath.   Cardiovascular: Negative for chest pain, palpitations and leg swelling.  Gastrointestinal: Negative for abdominal pain and abdominal distention.  Genitourinary: Negative for dysuria, urgency and difficulty urinating.  Musculoskeletal: Negative for arthralgias and gait problem.  Skin: Negative for color change and rash.  Hematological: Negative for adenopathy.  Psychiatric/Behavioral: Negative for sleep disturbance and dysphoric mood. The patient is not nervous/anxious.        Objective:   Physical Exam  Constitutional: He is oriented to person, place, and time. He appears well-developed and well-nourished. No distress.  HENT:  Head: Normocephalic and atraumatic.  Right Ear: External ear normal.  Left Ear: External ear normal.  Nose: Nose normal.  Mouth/Throat: Oropharynx is clear and moist. No oropharyngeal exudate.  Eyes: Conjunctivae and EOM are normal. Pupils are equal, round, and reactive to light. Right eye exhibits no discharge. Left eye exhibits no discharge. No scleral icterus.  Neck: Normal range of motion. Neck supple. No tracheal deviation present. No thyromegaly present.  Cardiovascular: Normal rate, regular rhythm and normal heart sounds.  Exam  reveals no gallop and no friction rub.   No murmur heard. Pulmonary/Chest: Effort normal. No accessory muscle usage. Not tachypneic. No respiratory distress. He has decreased breath sounds. He has wheezes. He has rhonchi. He has no rales. He exhibits no tenderness.  Musculoskeletal: He exhibits no edema.       Cervical back: He exhibits decreased range of motion, pain and spasm.       Lumbar back: He exhibits decreased range of motion, pain and spasm.  Lymphadenopathy:    He has no cervical adenopathy.  Neurological: He is alert and oriented to person, place, and time. No cranial nerve deficit. Coordination normal.  Skin: Skin is warm and dry. No rash noted. He is not diaphoretic. No erythema. No pallor.  Psychiatric: He has a normal mood and affect. His behavior is normal. Judgment and thought content normal.          Assessment & Plan:

## 2012-05-04 NOTE — Assessment & Plan Note (Signed)
On chronic testosterone supplementation. Not recently seen by urology because of lack of health insurance. Discussed risks of testosterone supplementation including CAD. Will check testosterone level today.

## 2012-05-04 NOTE — Assessment & Plan Note (Signed)
Symptoms consistent with bronchitis. Already on prednisone taper for pain management. Will continue with this and will add Levaquin x 7 days. Will continue Hydrocodone prn pain and cough (provided by pain clinic). Pt will call if no improvement.

## 2012-05-04 NOTE — Assessment & Plan Note (Signed)
Followed by local pain clinic. Secondary to DJD. Will continue to follow with pain management.

## 2012-05-05 ENCOUNTER — Encounter: Payer: Self-pay | Admitting: *Deleted

## 2012-05-05 LAB — TESTOSTERONE, FREE, TOTAL, SHBG: Sex Hormone Binding: 24 nmol/L (ref 13–71)

## 2012-05-06 LAB — URINE CULTURE
Colony Count: NO GROWTH
Organism ID, Bacteria: NO GROWTH

## 2012-05-10 ENCOUNTER — Other Ambulatory Visit: Payer: Self-pay | Admitting: *Deleted

## 2012-05-20 ENCOUNTER — Encounter: Payer: Self-pay | Admitting: *Deleted

## 2012-05-25 ENCOUNTER — Telehealth: Payer: Self-pay | Admitting: *Deleted

## 2012-05-25 NOTE — Telephone Encounter (Signed)
Patient received letter you sent out and is returning your call.

## 2012-05-26 NOTE — Telephone Encounter (Signed)
Left message to call back  

## 2012-05-31 NOTE — Telephone Encounter (Signed)
Left message to call back  

## 2012-06-01 NOTE — Telephone Encounter (Signed)
Left message saying $125 is what we try to collect up front and anything over that patient will be billed. I did tell him that we would still see him if he couldn't pay the whole amount.  I advised patient that if he had any questions to please call me back.

## 2012-06-01 NOTE — Telephone Encounter (Signed)
You can leave a message on his voicemail, he would like a return call.

## 2012-06-01 NOTE — Telephone Encounter (Signed)
He only needs to come in for an urine sample.

## 2012-06-01 NOTE — Telephone Encounter (Signed)
Patient called back, said he just missed your call.

## 2012-06-01 NOTE — Telephone Encounter (Signed)
Patient returned call, he stated he no longer has any insurance and would like to know how much would this be? He wants to know before he comes in so he can know how much money to bring.

## 2012-06-02 NOTE — Telephone Encounter (Signed)
Left another message for patient saying we would bill him for the urine sample.

## 2012-06-03 ENCOUNTER — Telehealth: Payer: Self-pay | Admitting: *Deleted

## 2012-06-03 NOTE — Telephone Encounter (Signed)
Repeat urinalysis. For hematuria

## 2012-06-03 NOTE — Telephone Encounter (Signed)
Pt is coming in for labs Monday 04.07.2014 what labs and dx code would you like for this pt?  Thank you   

## 2012-06-06 ENCOUNTER — Other Ambulatory Visit (INDEPENDENT_AMBULATORY_CARE_PROVIDER_SITE_OTHER): Payer: Self-pay | Admitting: Internal Medicine

## 2012-06-06 ENCOUNTER — Other Ambulatory Visit (INDEPENDENT_AMBULATORY_CARE_PROVIDER_SITE_OTHER): Payer: Self-pay

## 2012-06-06 DIAGNOSIS — R319 Hematuria, unspecified: Secondary | ICD-10-CM

## 2012-06-06 LAB — URINALYSIS, ROUTINE W REFLEX MICROSCOPIC
Leukocytes, UA: NEGATIVE
Specific Gravity, Urine: >=1.03 (ref 1.000–1.030)
Urine Glucose: NEGATIVE
Urobilinogen, UA: 0.2 (ref 0.0–1.0)

## 2012-06-07 ENCOUNTER — Telehealth: Payer: Self-pay | Admitting: *Deleted

## 2012-06-07 DIAGNOSIS — R319 Hematuria, unspecified: Secondary | ICD-10-CM

## 2012-06-07 NOTE — Telephone Encounter (Signed)
Patient has been informed of lab results and would like to move forward with referral to urologist.

## 2012-06-07 NOTE — Telephone Encounter (Signed)
Message copied by Theola Sequin on Tue Jun 07, 2012  9:10 AM ------      Message from: Ronna Polio A      Created: Mon Jun 06, 2012  4:20 PM       Urine shows persistent blood. I would like to set up referral to urology for further evaluation. ------

## 2012-06-15 ENCOUNTER — Ambulatory Visit: Payer: Self-pay | Admitting: Internal Medicine

## 2012-08-09 ENCOUNTER — Telehealth: Payer: Self-pay | Admitting: *Deleted

## 2012-08-09 NOTE — Telephone Encounter (Signed)
Patient left message stating he does not have any insurance at the moment and would like some samples of Albuterol.

## 2012-08-09 NOTE — Telephone Encounter (Signed)
Put sample for him on your desk

## 2012-08-09 NOTE — Telephone Encounter (Signed)
Left detailed information on patient voicemail stating we had 1 sample and I left it up front for him to pick up at his convenience.

## 2012-08-19 ENCOUNTER — Telehealth: Payer: Self-pay | Admitting: Internal Medicine

## 2012-08-19 NOTE — Telephone Encounter (Signed)
Letter given to Dr. Dan Humphreys

## 2012-08-19 NOTE — Telephone Encounter (Signed)
Carollee Herter, This is the pt who needs financial assistance. Can you give him information on [facility] at [facility]?

## 2012-08-19 NOTE — Telephone Encounter (Signed)
Letter in envelope in Dr. box

## 2012-08-22 NOTE — Telephone Encounter (Signed)
I called and spoke with patient and advised him how to get the charity care from [facility]. He also asked about getting records, I advised him that he would need to fill out a release form and there was a charge from [facility] on getting his records to take to his hearing for disability. He understood how to contact [facility] and he will be by tomorrow to sign release.

## 2012-10-23 ENCOUNTER — Emergency Department: Payer: Self-pay | Admitting: Emergency Medicine

## 2012-10-24 ENCOUNTER — Emergency Department (HOSPITAL_COMMUNITY)
Admission: EM | Admit: 2012-10-24 | Discharge: 2012-10-24 | Disposition: A | Discharge status: Home / Self Care | Payer: Self-pay | Attending: Emergency Medicine | Admitting: Emergency Medicine

## 2012-10-24 ENCOUNTER — Emergency Department (HOSPITAL_COMMUNITY): Payer: Self-pay

## 2012-10-24 ENCOUNTER — Telehealth: Payer: Self-pay | Admitting: Internal Medicine

## 2012-10-24 ENCOUNTER — Encounter (HOSPITAL_COMMUNITY): Payer: Self-pay | Admitting: Emergency Medicine

## 2012-10-24 DIAGNOSIS — Z8739 Personal history of other diseases of the musculoskeletal system and connective tissue: Secondary | ICD-10-CM | POA: Insufficient documentation

## 2012-10-24 DIAGNOSIS — F17200 Nicotine dependence, unspecified, uncomplicated: Secondary | ICD-10-CM | POA: Insufficient documentation

## 2012-10-24 DIAGNOSIS — M549 Dorsalgia, unspecified: Secondary | ICD-10-CM | POA: Insufficient documentation

## 2012-10-24 DIAGNOSIS — Z79899 Other long term (current) drug therapy: Secondary | ICD-10-CM | POA: Insufficient documentation

## 2012-10-24 DIAGNOSIS — G8929 Other chronic pain: Secondary | ICD-10-CM | POA: Insufficient documentation

## 2012-10-24 MED ORDER — PREDNISONE 20 MG PO TABS
60.0000 mg | ORAL_TABLET | Freq: Once | ORAL | Status: AC
  Administered 2012-10-24: 60 mg via ORAL
  Filled 2012-10-24: qty 3

## 2012-10-24 MED ORDER — PREDNISONE 20 MG PO TABS: 40.0000 mg | ORAL_TABLET | Freq: Every day | ORAL | Status: DC

## 2012-10-24 NOTE — ED Notes (Addendum)
Pt in neck and back splint; sees pain management clinic. Takes 60 mg of morphine 3 x day. Reports that he "just wants and x ray". Pt reports he is able to ambulate but it is extremely painful.  PA aware.

## 2012-10-24 NOTE — ED Notes (Signed)
Pt returned to room  

## 2012-10-24 NOTE — Telephone Encounter (Signed)
Patient Information:  Caller Name: Jared Farley  Phone: [phone]  Patient: Jared Farley, Jared Farley  Gender: Male  DOB: 1960-12-20  Age: 52 Years  PCP: Ronna Polio (Adults only)  Office Follow Up:  Does the office need to follow up with this patient?: No  Instructions For The Office: N/A   Symptoms  Reason For Call & Symptoms: Pt states he sneezed and injuried lower back.  Reviewed Health History In EMR: Yes  Reviewed Medications In EMR: Yes  Reviewed Allergies In EMR: Yes  Reviewed Surgeries / Procedures: Yes  Date of Onset of Symptoms: 10/20/2012  Guideline(s) Used:  Back Pain  Disposition Per Guideline:   Go to ED Now (or to Office with PCP Approval)  Reason For Disposition Reached:   Weakness of a leg or foot (e.g., unable to bear weight, dragging foot)  Advice Given:  N/A  Patient Will Follow Care Advice:  YES

## 2012-10-24 NOTE — ED Provider Notes (Signed)
CSN: [id number]     Arrival date & time 10/24/12  1506 History   This chart was scribed for a non-physician practitioner working with Loren Racer, MD by Jiles Prows, ED scribe. This patient was seen in room TR07C/TR07C and the patient's care was started at 5:21 PM.  Chief Complaint  Patient presents with  . Back Pain   The history is provided by the patient and medical records. No language interpreter was used.   HPI Comments: Jared Farley is a 52 y.o. male who presents to the Emergency Department complaining of moderate to severe, constant back pain onset years ago that worsened 5 days ago.  Pt reports that he has chronic back problems that were exacerbated on Thursday (5 days ago) after a few sneezes.  Pt states that this pain has increased dramatically.  He claims that he cannot sleep or get comfortable.  Pt denies headache, diaphoresis, fever, chills, nausea, vomiting, diarrhea, weakness, cough, SOB and any other pain.   Pt reports h/o chronic testicular pain.  He states that in January he was on a prednisone tapering series that was very helpful.  Past Medical History  Diagnosis Date  . Degenerative joint disease   . Chronic pain following surgery or procedure     testicular   History reviewed. No pertinent past surgical history. History reviewed. No pertinent family history. History  Substance Use Topics  . Smoking status: Current Every Day Smoker -- 1.00 packs/day  . Smokeless tobacco: Never Used  . Alcohol Use: Yes     Comment: rarely    Review of Systems  Musculoskeletal: Positive for back pain.  All other systems reviewed and are negative.    Allergies  Review of patient's allergies indicates no known allergies.  Home Medications   Current Outpatient Rx  Name  Route  Sig  Dispense  Refill  . albuterol (PROAIR HFA) 108 (90 BASE) MCG/ACT inhaler   Inhalation   Inhale 2 puffs into the lungs every 6 (six) hours as needed for wheezing.   18 g   0   .  alfuzosin (UROXATRAL) 10 MG 24 hr tablet   Oral   Take 1 tablet (10 mg total) by mouth daily.   30 tablet   6   . carisoprodol (SOMA) 350 MG tablet   Oral   Take 700 mg by mouth at bedtime as needed. For pain         . HYDROcodone-acetaminophen (NORCO) 10-325 MG per tablet   Oral   Take 1 tablet by mouth every 6 (six) hours as needed for pain. For pain         . morphine (MS CONTIN) 60 MG 12 hr tablet   Oral   Take 60 mg by mouth 3 (three) times daily.         Marland Kitchen testosterone cypionate (DEPOTESTOTERONE CYPIONATE) 200 MG/ML injection   Intramuscular   Inject 80 mg into the muscle once a week. 1/4 ml every week. Every Thursday.          BP 112/78  Pulse 83  Temp(Src) 98 F (36.7 C) (Oral)  Resp 15  Ht 6' (1.829 m)  Wt 185 lb (83.915 kg)  BMI 25.08 kg/m2  SpO2 96% Physical Exam  Nursing note and vitals reviewed. Constitutional: He is oriented to person, place, and time. He appears well-developed and well-nourished. No distress.  Pt uncomfortable.  HENT:  Head: Normocephalic and atraumatic.  Eyes: Conjunctivae and EOM are normal. Right eye exhibits no  discharge. Left eye exhibits no discharge. No scleral icterus.  Neck: Normal range of motion. Neck supple. No tracheal deviation present.  Cardiovascular: Normal rate, regular rhythm and normal heart sounds.  Exam reveals no gallop and no friction rub.   No murmur heard. Pulmonary/Chest: Effort normal and breath sounds normal. No respiratory distress. He has no wheezes.  Abdominal: Soft. He exhibits no distension. There is no tenderness.  Musculoskeletal: Normal range of motion.  Lumbar paraspinal muscles tender to palpation, no bony tenderness, step-offs, or gross abnormality or deformity of spine, patient is able to ambulate, moves all extremities  Neurological: He is alert and oriented to person, place, and time.  Sensation and strength intact bilaterally  Skin: Skin is warm and dry. He is not diaphoretic.   Psychiatric: He has a normal mood and affect. His behavior is normal. Judgment and thought content normal.   ED Course  Procedures (including critical care time) DIAGNOSTIC STUDIES: Filed Vitals:   10/24/12 1552  BP: 112/78  Pulse: 83  Temp: 98 F (36.7 C)  TempSrc: Oral  Resp: 15  Height: 6' (1.829 m)  Weight: 185 lb (83.915 kg)  SpO2: 96%   COORDINATION OF CARE: 5:23 PM - Discussed ED treatment with pt at bedside including mild stretches, ice, heat, and taper of prednisone and pt agrees.   Labs Review Labs Reviewed - No data to display Imaging Review Dg Lumbar Spine Complete  10/24/2012   CLINICAL DATA:  Chronic low back pain.  EXAM: LUMBAR SPINE - COMPLETE 4+ VIEW  COMPARISON:  None.  FINDINGS: Five non-rib-bearing lumbar vertebrae. Disk space narrowing and anterior, lateral and posterior spur formation at the L4-5 and L5-S1 levels with discogenic sclerosis at the L4-5 level. No fractures, pars defect or subluxations.  IMPRESSION: Degenerative changes, as described above.   Electronically Signed   By: Gordan Payment   On: 10/24/2012 17:17   MDM   1. Chronic back pain    Patient with back pain.  No neurological deficits and normal neuro exam.  Patient can walk but states is painful.  No loss of bowel or bladder control.  No concern for cauda equina.  No fever, night sweats, weight loss, h/o cancer, IVDU.  RICE protocol and pain medicine indicated and discussed with patient.   I personally performed the services described in this documentation, which was scribed in my presence. The recorded information has been reviewed and is accurate.     Roxy Horseman, PA-C 10/24/12 2352

## 2012-10-24 NOTE — ED Notes (Signed)
PT ambulated with baseline gait; VSS; A&Ox3; no signs of distress; respirations even and unlabored; skin warm and dry; no questions upon discharge.  

## 2012-10-24 NOTE — ED Notes (Signed)
PA at bedside.

## 2012-10-24 NOTE — ED Notes (Signed)
Back pain that started on Saturday "after I sneezed". Hx of Degenerative disc disease, Spinal stenosis. Seen at pain clinic. Last pain meds at 1200. In wheelchair, not able to ambulate

## 2012-10-24 NOTE — Telephone Encounter (Signed)
Patient verified following call a nurse advice patient in route to [facility] ED. FYI

## 2012-10-26 NOTE — ED Provider Notes (Signed)
Medical screening examination/treatment/procedure(s) were performed by non-physician practitioner and as supervising physician I was immediately available for consultation/collaboration.   Deshanda Molitor, MD 10/26/12 0716 

## 2013-01-03 ENCOUNTER — Emergency Department: Payer: Self-pay | Admitting: Emergency Medicine

## 2013-01-03 LAB — BASIC METABOLIC PANEL
Anion Gap: 8 (ref 7–16)
BUN: 11 mg/dL (ref 7–18)
Calcium, Total: 8.6 mg/dL (ref 8.5–10.1)
Co2: 24 mmol/L (ref 21–32)
EGFR (African American): >60
EGFR (Non-African Amer.): >60
Glucose: 121 mg/dL — ABNORMAL HIGH (ref 65–99)
Potassium: 3.9 mmol/L (ref 3.5–5.1)
Sodium: 134 mmol/L — ABNORMAL LOW (ref 136–145)

## 2013-01-03 LAB — CBC: Platelet: 184 10*3/uL (ref 150–440)

## 2013-01-04 ENCOUNTER — Telehealth: Payer: Self-pay | Admitting: Internal Medicine

## 2013-01-04 ENCOUNTER — Ambulatory Visit (INDEPENDENT_AMBULATORY_CARE_PROVIDER_SITE_OTHER): Payer: Self-pay | Admitting: Adult Health

## 2013-01-04 ENCOUNTER — Encounter: Payer: Self-pay | Admitting: Adult Health

## 2013-01-04 VITALS — BP 118/74 | HR 107 | Temp 97.8°F | Resp 18 | Ht 72.0 in

## 2013-01-04 DIAGNOSIS — Z716 Tobacco abuse counseling: Secondary | ICD-10-CM | POA: Insufficient documentation

## 2013-01-04 DIAGNOSIS — F17200 Nicotine dependence, unspecified, uncomplicated: Secondary | ICD-10-CM

## 2013-01-04 DIAGNOSIS — Z7189 Other specified counseling: Secondary | ICD-10-CM

## 2013-01-04 DIAGNOSIS — J449 Chronic obstructive pulmonary disease, unspecified: Secondary | ICD-10-CM | POA: Insufficient documentation

## 2013-01-04 DIAGNOSIS — J441 Chronic obstructive pulmonary disease with (acute) exacerbation: Secondary | ICD-10-CM | POA: Insufficient documentation

## 2013-01-04 DIAGNOSIS — J40 Bronchitis, not specified as acute or chronic: Secondary | ICD-10-CM

## 2013-01-04 DIAGNOSIS — J440 Chronic obstructive pulmonary disease with acute lower respiratory infection: Secondary | ICD-10-CM

## 2013-01-04 LAB — POCT INFLUENZA A/B
Influenza A, POC: NEGATIVE
Influenza B, POC: NEGATIVE

## 2013-01-04 MED ORDER — GUAIFENESIN-CODEINE 100-10 MG/5ML PO SOLN: 5.0000 mL | Freq: Three times a day (TID) | ORAL | Status: DC | PRN

## 2013-01-04 MED ORDER — ALBUTEROL SULFATE HFA 108 (90 BASE) MCG/ACT IN AERS: 2.0000 | INHALATION_SPRAY | Freq: Four times a day (QID) | RESPIRATORY_TRACT | Status: DC | PRN

## 2013-01-04 MED ORDER — ALBUTEROL SULFATE (2.5 MG/3ML) 0.083% IN NEBU: 2.5000 mg | INHALATION_SOLUTION | Freq: Four times a day (QID) | RESPIRATORY_TRACT | Status: DC | PRN

## 2013-01-04 MED ORDER — PREDNISONE 10 MG PO TABS: ORAL_TABLET | ORAL | Status: DC

## 2013-01-04 NOTE — Assessment & Plan Note (Addendum)
Appears acutely ill. Start prednisone taper, albuterol, Levaquin 750 mg daily x 5. Concerned about pneumonia. Requesting records from [facility]. Nebulizer treatment with albuterol in clinic. Influenza swab negative. Recommended going back to ED but he wants to be treated at home. Advised patient that should he develop worsening shortness of breath, chest tightness not relieved by inhalers he needs to go to the emergency room. Patient agreed

## 2013-01-04 NOTE — Assessment & Plan Note (Signed)
Very strong odor of nicotine. Patient denies that he has been smoking recently. He reports that he has not smoked in 3 days. Advised on the importance of smoking cessation. Continue to follow

## 2013-01-04 NOTE — Telephone Encounter (Signed)
Pt was calling us back and was wanting to know if we could give him a call as soon as his results come back from his labs and everything. I told him was always do that but I would let you all know that he wanted a call back.

## 2013-01-04 NOTE — Patient Instructions (Signed)
  Start Levaquin 750 mg daily for 5 days.  Also begin a prednisone taper as follows:   Day one-6 tablets  Day 2 - 5 1/2 tablets  Day 3 - 5 tablets  Day 4 - 4 1/2 tablets  Day 5 - 4 tablets  Day 6 - 3 1/2 tablets  Day 7 - 3 tablets  Day 8 - 2 1/2 tablets  Day 9 - 2 tablets  Day 10 - 1 1/2 tablets  Day 11 - 1 tablet  Day 12 - 1/2 tablet and complete

## 2013-01-04 NOTE — Telephone Encounter (Signed)
Thank you Jamie

## 2013-01-04 NOTE — Progress Notes (Signed)
Pre-visit discussion using our clinic review tool. No additional management support is needed unless otherwise documented below in the visit note.  

## 2013-01-04 NOTE — Progress Notes (Signed)
  Subjective:    Patient ID: Jared Farley, male    DOB: 12-05-1960, 52 y.o.   MRN: [id number]  HPI  Patient is a 52 y/o male with hx of, chronic bronchitis, COPD, ongoing tobacco abuse who presents to clinic following ED visit last evening for COPD exacerbation, cough, chills, sweating. He had xray and labs drawn and asked to sit back outside in the waiting room. Patient also reports ruptured disc and reports he waited for 5 hours and was unable to continue to sit since he was in so much pain. Patient went home. He presents this morning with sob, coughing, fever, chills. He normally uses an albuterol inhaler however he has run out of this medication.   Current Outpatient Prescriptions on File Prior to Visit  Medication Sig Dispense Refill  . alfuzosin (UROXATRAL) 10 MG 24 hr tablet Take 1 tablet (10 mg total) by mouth daily.  30 tablet  6  . carisoprodol (SOMA) 350 MG tablet Take 700 mg by mouth at bedtime as needed. For pain      . HYDROcodone-acetaminophen (NORCO) 10-325 MG per tablet Take 1 tablet by mouth every 6 (six) hours as needed for pain. For pain      . morphine (MS CONTIN) 60 MG 12 hr tablet Take 60 mg by mouth 3 (three) times daily.      Marland Kitchen testosterone cypionate (DEPOTESTOTERONE CYPIONATE) 200 MG/ML injection Inject 80 mg into the muscle once a week. 1/4 ml every week. Every Thursday.       No current facility-administered medications on file prior to visit.     Review of Systems  Constitutional: Positive for fever and chills.  HENT: Positive for congestion, postnasal drip, rhinorrhea and sinus pressure. Negative for sore throat.   Respiratory: Positive for cough, shortness of breath and wheezing.   Cardiovascular: Negative.   Gastrointestinal: Negative for nausea, vomiting, abdominal pain and diarrhea.  Genitourinary: Negative.   Musculoskeletal: Positive for back pain.       Using walker for ambulation  Skin:       Heavy perspiration  Neurological: Positive for weakness  and light-headedness.  Psychiatric/Behavioral: Negative.   All other systems reviewed and are negative.       Objective:   Physical Exam  Constitutional: He is oriented to person, place, and time.  Acutely ill 52 y/o male  HENT:  Head: Normocephalic and atraumatic.  Neck: No tracheal deviation present.  Cardiovascular: Normal rate, regular rhythm and normal heart sounds.   Pulmonary/Chest:  Coarse rhonchi does not clear with cough. Had chest xray in the ED last evening.  Lymphadenopathy:    He has cervical adenopathy.  Neurological: He is alert and oriented to person, place, and time.  Skin: Skin is warm and dry.  Psychiatric: He has a normal mood and affect. His behavior is normal. Judgment and thought content normal.    BP 118/74  Pulse 107  Temp(Src) 97.8 F (36.6 C) (Oral)  Resp 18  Ht 6' (1.829 m)  SpO2 96%       Assessment & Plan:

## 2013-01-05 ENCOUNTER — Telehealth: Payer: Self-pay | Admitting: Internal Medicine

## 2013-01-05 NOTE — Telephone Encounter (Signed)
Pt called to let you know he is 50% better than he was yesterday.  Not sweating any more appetite is coming back and breathing better. He wanted to know if you go the information from [facility] yet.  He would like to get results of labs xrays ekg  and wanted to know what stage his copd is in.

## 2013-01-05 NOTE — Telephone Encounter (Signed)
Did we get his information from [facility]?

## 2013-01-06 NOTE — Telephone Encounter (Signed)
Records given to Raquel

## 2013-01-06 NOTE — Telephone Encounter (Signed)
Sent for records

## 2013-01-09 ENCOUNTER — Other Ambulatory Visit: Payer: Self-pay | Admitting: Adult Health

## 2013-01-09 MED ORDER — LEVOFLOXACIN 500 MG PO TABS: 500.0000 mg | ORAL_TABLET | Freq: Every day | ORAL | Status: DC

## 2013-01-09 NOTE — Telephone Encounter (Signed)
Pt called back  He stated he wheezing trouble breathing  He wanted to know if raquel could send him a rx for leviquin to [facility] Sent to triage

## 2013-01-09 NOTE — Telephone Encounter (Signed)
Pt.notified

## 2013-01-09 NOTE — Telephone Encounter (Signed)
Sent prescription

## 2013-02-07 ENCOUNTER — Ambulatory Visit (INDEPENDENT_AMBULATORY_CARE_PROVIDER_SITE_OTHER): Payer: Self-pay | Admitting: Internal Medicine

## 2013-02-07 ENCOUNTER — Encounter: Payer: Self-pay | Admitting: Internal Medicine

## 2013-02-07 VITALS — BP 118/60 | HR 96 | Temp 98.0°F | Wt 178.0 lb

## 2013-02-07 DIAGNOSIS — Z716 Tobacco abuse counseling: Secondary | ICD-10-CM

## 2013-02-07 DIAGNOSIS — G8929 Other chronic pain: Secondary | ICD-10-CM

## 2013-02-07 DIAGNOSIS — F17200 Nicotine dependence, unspecified, uncomplicated: Secondary | ICD-10-CM

## 2013-02-07 DIAGNOSIS — Z7189 Other specified counseling: Secondary | ICD-10-CM

## 2013-02-07 DIAGNOSIS — J441 Chronic obstructive pulmonary disease with (acute) exacerbation: Secondary | ICD-10-CM

## 2013-02-07 NOTE — Progress Notes (Signed)
Subjective:    Patient ID: Jared Farley, male    DOB: [birth year], 52 y.o.   MRN: [id number]  HPI 52YO male with h/o COPD, tobacco use, and chronic back and neck pain presents for follow up after recent COPD exacerbation.  COPD exacerbation - Symptoms of dyspnea and cough have improved. No fever, chills. Continues prednisone taper and prn albuterol. Completed Levaquin. Continues to smoke about 12 cigarettes per day. Trying to cut back.  Chronic pain - In process of applying for disability. Severe pain in neck and back which is improved slightly with use of MS Contin and Hydrocodone prn. Followed by Dr. Kerney Elbe at pain clinic.  Outpatient Encounter Prescriptions as of 02/07/2013  Medication Sig  . albuterol (PROAIR HFA) 108 (90 BASE) MCG/ACT inhaler Inhale 2 puffs into the lungs every 6 (six) hours as needed for wheezing.  Marland Kitchen albuterol (PROVENTIL) (2.5 MG/3ML) 0.083% nebulizer solution Take 3 mLs (2.5 mg total) by nebulization every 6 (six) hours as needed for wheezing or shortness of breath.  . alfuzosin (UROXATRAL) 10 MG 24 hr tablet Take 1 tablet (10 mg total) by mouth daily.  . carisoprodol (SOMA) 350 MG tablet Take 700 mg by mouth at bedtime as needed. For pain  . HYDROcodone-acetaminophen (NORCO) 10-325 MG per tablet Take 1 tablet by mouth every 6 (six) hours as needed for pain. For pain  . morphine (MS CONTIN) 60 MG 12 hr tablet Take 60 mg by mouth 3 (three) times daily.  Marland Kitchen testosterone cypionate (DEPOTESTOTERONE CYPIONATE) 200 MG/ML injection Inject 80 mg into the muscle once a week. 1/4 ml every week. Every Thursday.   BP 118/60  Pulse 96  Temp(Src) 98 F (36.7 C) (Oral)  Wt 178 lb (80.74 kg)  SpO2 95%  Review of Systems  Constitutional: Positive for fatigue. Negative for fever, chills, activity change, appetite change and unexpected weight change.  Eyes: Negative for visual disturbance.  Respiratory: Positive for cough and shortness of breath.   Cardiovascular: Negative for  chest pain, palpitations and leg swelling.  Gastrointestinal: Negative for abdominal pain and abdominal distention.  Genitourinary: Negative for dysuria, urgency and difficulty urinating.  Musculoskeletal: Positive for arthralgias, back pain, myalgias and neck pain. Negative for gait problem.  Skin: Negative for color change and rash.  Hematological: Negative for adenopathy.  Psychiatric/Behavioral: Negative for sleep disturbance and dysphoric mood. The patient is not nervous/anxious.        Objective:   Physical Exam  Constitutional: He is oriented to person, place, and time. He appears well-developed and well-nourished. No distress.  HENT:  Head: Normocephalic and atraumatic.  Right Ear: External ear normal.  Left Ear: External ear normal.  Nose: Nose normal.  Mouth/Throat: Oropharynx is clear and moist. No oropharyngeal exudate.  Eyes: Conjunctivae and EOM are normal. Pupils are equal, round, and reactive to light. Right eye exhibits no discharge. Left eye exhibits no discharge. No scleral icterus.  Neck: Normal range of motion. Neck supple. No tracheal deviation present. No thyromegaly present.  Cardiovascular: Normal rate, regular rhythm and normal heart sounds.  Exam reveals no gallop and no friction rub.   No murmur heard. Pulmonary/Chest: Effort normal. No accessory muscle usage. Not tachypneic. No respiratory distress. He has decreased breath sounds. He has no wheezes. He has no rhonchi. He has no rales. He exhibits no tenderness.  Musculoskeletal: He exhibits no edema.       Cervical back: He exhibits decreased range of motion and pain.  Lumbar back: He exhibits decreased range of motion and pain.  Lymphadenopathy:    He has no cervical adenopathy.  Neurological: He is alert and oriented to person, place, and time. No cranial nerve deficit. Coordination normal.  Skin: Skin is warm and dry. No rash noted. He is not diaphoretic. No erythema. No pallor.  Psychiatric: He has  a normal mood and affect. His behavior is normal. Judgment and thought content normal.          Assessment & Plan:

## 2013-02-07 NOTE — Assessment & Plan Note (Signed)
Symptoms of chronic pain in neck, back. Followed by Dr. Kerney Elbe in pain management. Per notes, not a surgical candidate. On chronic MS Contin and prn Hydrocodone with minimal improvement. Looking into SSI. Continue to follow with Dr. Kerney Elbe.

## 2013-02-07 NOTE — Assessment & Plan Note (Signed)
Symptoms of recent COPD exacerbation have improved after Levaquin and Prednisone taper. Will continue taper as prescribed and prn albuterol. Encouraged smoking cessation. Plan follow up in 6 months or sooner as needed.

## 2013-02-07 NOTE — Progress Notes (Signed)
Pre-visit discussion using our clinic review tool. No additional management support is needed unless otherwise documented below in the visit note.  

## 2013-04-06 ENCOUNTER — Ambulatory Visit: Payer: Self-pay

## 2013-05-18 ENCOUNTER — Emergency Department: Payer: Self-pay | Admitting: Internal Medicine

## 2013-05-23 ENCOUNTER — Emergency Department: Payer: Self-pay | Admitting: Emergency Medicine

## 2013-11-09 ENCOUNTER — Telehealth: Payer: Self-pay | Admitting: Internal Medicine

## 2013-11-09 NOTE — Telephone Encounter (Signed)
Pt called in and is needing a refill of alfuzosin (UROXATRAL) 10mg .

## 2013-11-09 NOTE — Telephone Encounter (Signed)
Last refill 10.18.2012; last OV 12.9.2014.  Please advise refill.

## 2013-11-09 NOTE — Telephone Encounter (Signed)
Needs to get this medication from his urologist

## 2013-11-10 NOTE — Telephone Encounter (Signed)
Notified pt. 

## 2013-11-27 ENCOUNTER — Ambulatory Visit (INDEPENDENT_AMBULATORY_CARE_PROVIDER_SITE_OTHER): Payer: Medicare Other | Admitting: Internal Medicine

## 2013-11-27 ENCOUNTER — Encounter: Payer: Self-pay | Admitting: Internal Medicine

## 2013-11-27 VITALS — BP 102/60 | HR 81 | Temp 98.2°F | Ht 70.25 in | Wt 160.2 lb

## 2013-11-27 DIAGNOSIS — N400 Enlarged prostate without lower urinary tract symptoms: Secondary | ICD-10-CM | POA: Insufficient documentation

## 2013-11-27 DIAGNOSIS — Z0000 Encounter for general adult medical examination without abnormal findings: Secondary | ICD-10-CM

## 2013-11-27 DIAGNOSIS — Z136 Encounter for screening for cardiovascular disorders: Secondary | ICD-10-CM

## 2013-11-27 DIAGNOSIS — J42 Unspecified chronic bronchitis: Secondary | ICD-10-CM

## 2013-11-27 DIAGNOSIS — G8929 Other chronic pain: Secondary | ICD-10-CM

## 2013-11-27 DIAGNOSIS — N138 Other obstructive and reflux uropathy: Secondary | ICD-10-CM | POA: Insufficient documentation

## 2013-11-27 DIAGNOSIS — J40 Bronchitis, not specified as acute or chronic: Secondary | ICD-10-CM

## 2013-11-27 DIAGNOSIS — G4700 Insomnia, unspecified: Secondary | ICD-10-CM

## 2013-11-27 DIAGNOSIS — E291 Testicular hypofunction: Secondary | ICD-10-CM

## 2013-11-27 DIAGNOSIS — Z23 Encounter for immunization: Secondary | ICD-10-CM

## 2013-11-27 LAB — POCT URINALYSIS DIPSTICK
GLUCOSE UA: NEGATIVE
KETONES UA: 15
LEUKOCYTES UA: NEGATIVE
Nitrite, UA: NEGATIVE
Protein, UA: 30
Spec Grav, UA: 1.025
Urobilinogen, UA: 1
pH, UA: 5.5

## 2013-11-27 MED ORDER — ALBUTEROL SULFATE HFA 108 (90 BASE) MCG/ACT IN AERS: 2.0000 | INHALATION_SPRAY | Freq: Four times a day (QID) | RESPIRATORY_TRACT | Status: DC | PRN

## 2013-11-27 NOTE — Assessment & Plan Note (Signed)
Recent insomnia with stress of living in home, caring for mother and brother. Given polypharmacy and use if long acting narcotic, we discussed risks of medication to help with sleep. Will continue to monitor for now and see if things improve as situation improves. Follow up 3 months and prn.

## 2013-11-27 NOTE — Assessment & Plan Note (Signed)
Symptoms recently stable. Will continue prn Albuterol. Pneumovax given today. Flu vaccine given today.

## 2013-11-27 NOTE — Assessment & Plan Note (Signed)
Symptoms stable on current medications. Will continue to follow with Pain Management physician.

## 2013-11-27 NOTE — Assessment & Plan Note (Signed)
Continue Uroxatral. Follow up with urology as scheduled.

## 2013-11-27 NOTE — Progress Notes (Signed)
Pre visit review using our clinic review tool, if applicable. No additional management support is needed unless otherwise documented below in the visit note. 

## 2013-11-27 NOTE — Patient Instructions (Signed)
Flu shot today.  Labs today.   Follow up in 3 months.

## 2013-11-27 NOTE — Addendum Note (Signed)
Addended by: Vernetta Honey on: 11/27/2013 03:42 PM   Modules accepted: Orders

## 2013-11-27 NOTE — Assessment & Plan Note (Signed)
Will check testosterone level today. Follow up with Dr. Jacqlyn Larsen in Urology as scheduled.

## 2013-11-27 NOTE — Progress Notes (Signed)
Subjective:    Patient ID: Jared Farley, male    DOB: 23-Apr-1960, 53 y.o.   MRN: [id number]  HPI 53YO male presents for follow up. Last seen 01/2013.  Started back on Prednisone 20mg  qod for back pain, by Dr. Philip Aspen. Scheduled to start wearing a back brace. Continues on [employer] and [employer].  Has appointment scheduled with Dr. Jacqlyn Larsen in urology. On Uroxatral. Had some recent urinary urgency which is improving. No fever.  Bought a home and is caring for his mother.  Struggling with insomnia. Went 3-4 days at one point unable to sleep. Attributes this to stress. Not taking anything for sleep. Usually Soma helps with sleep. Feeling better today.   Review of Systems  Constitutional: Negative for fever, chills, activity change, appetite change, fatigue and unexpected weight change.  Eyes: Negative for visual disturbance.  Respiratory: Positive for cough (chronic) and shortness of breath (chronic).   Cardiovascular: Negative for chest pain, palpitations and leg swelling.  Gastrointestinal: Negative for abdominal pain and abdominal distention.  Genitourinary: Positive for frequency and testicular pain (chronic). Negative for dysuria, urgency, hematuria, flank pain and difficulty urinating.  Musculoskeletal: Positive for arthralgias, back pain, myalgias and neck pain. Negative for gait problem.  Skin: Negative for color change and rash.  Hematological: Negative for adenopathy.  Psychiatric/Behavioral: Positive for sleep disturbance. Negative for dysphoric mood. The patient is not nervous/anxious.        Objective:    BP 102/60  Pulse 81  Temp(Src) 98.2 F (36.8 C) (Oral)  Ht 5' 10.25" (1.784 m)  Wt 160 lb 4 oz (72.689 kg)  BMI 22.84 kg/m2  SpO2 93% Physical Exam  Constitutional: He is oriented to person, place, and time. He appears well-developed and well-nourished. No distress.  HENT:  Head: Normocephalic and atraumatic.  Right Ear: External ear normal.  Left Ear: External ear  normal.  Nose: Nose normal.  Mouth/Throat: Oropharynx is clear and moist. No oropharyngeal exudate.  Eyes: Conjunctivae and EOM are normal. Pupils are equal, round, and reactive to light. Right eye exhibits no discharge. Left eye exhibits no discharge. No scleral icterus.  Neck: Normal range of motion. Neck supple. No tracheal deviation present. No thyromegaly present.  Cardiovascular: Normal rate, regular rhythm and normal heart sounds.  Exam reveals no gallop and no friction rub.   No murmur heard. Pulmonary/Chest: Effort normal. No accessory muscle usage. Not tachypneic. No respiratory distress. He has decreased breath sounds (prolonged exp phase). He has no wheezes. He has no rhonchi. He has no rales. He exhibits no tenderness.  Musculoskeletal: He exhibits no edema.       Cervical back: He exhibits decreased range of motion and pain.       Thoracic back: He exhibits decreased range of motion and pain.  Lymphadenopathy:    He has no cervical adenopathy.  Neurological: He is alert and oriented to person, place, and time. No cranial nerve deficit. Coordination normal.  Skin: Skin is warm and dry. No rash noted. He is not diaphoretic. No erythema. No pallor.  Psychiatric: He has a normal mood and affect. His behavior is normal. Judgment and thought content normal.          Assessment & Plan:   Problem List Items Addressed This Visit     Unprioritized   BPH (benign prostatic hyperplasia)     Continue Uroxatral. Follow up with urology as scheduled.    Relevant Orders      POCT urinalysis dipstick  PSA, Medicare   Chronic pain     Symptoms stable on current medications. Will continue to follow with Pain Management physician.    Relevant Medications      predniSONE (DELTASONE) 20 MG tablet   COPD (chronic obstructive pulmonary disease)     Symptoms recently stable. Will continue prn Albuterol. Pneumovax given today. Flu vaccine given today.    Relevant Medications       predniSONE (DELTASONE) 20 MG tablet      albuterol (PROAIR HFA) 108 (90 BASE) MCG/ACT inhaler   Hypogonadism male - Primary     Will check testosterone level today. Follow up with Dr. Jacqlyn Larsen in Urology as scheduled.    Relevant Orders      Testosterone, free, total   Insomnia     Recent insomnia with stress of living in home, caring for mother and brother. Given polypharmacy and use if long acting narcotic, we discussed risks of medication to help with sleep. Will continue to monitor for now and see if things improve as situation improves. Follow up 3 months and prn.     Other Visit Diagnoses   Routine general medical examination at a health care facility        Relevant Orders       CBC with Differential       Comprehensive metabolic panel       Lipid panel       Microalbumin / creatinine urine ratio       Vit D  25 hydroxy (rtn osteoporosis monitoring)       TSH    Bronchitis        Relevant Medications       albuterol (PROAIR HFA) 108 (90 BASE) MCG/ACT inhaler        Return in about 3 months (around 02/26/2014) for Wellness Visit.

## 2013-11-28 ENCOUNTER — Other Ambulatory Visit (INDEPENDENT_AMBULATORY_CARE_PROVIDER_SITE_OTHER): Payer: Medicare Other

## 2013-11-28 ENCOUNTER — Other Ambulatory Visit: Payer: Self-pay | Admitting: *Deleted

## 2013-11-28 ENCOUNTER — Encounter: Payer: Self-pay | Admitting: *Deleted

## 2013-11-28 DIAGNOSIS — Z139 Encounter for screening, unspecified: Secondary | ICD-10-CM

## 2013-11-28 DIAGNOSIS — Z0000 Encounter for general adult medical examination without abnormal findings: Secondary | ICD-10-CM

## 2013-11-28 DIAGNOSIS — E291 Testicular hypofunction: Secondary | ICD-10-CM

## 2013-11-28 LAB — URINALYSIS, ROUTINE W REFLEX MICROSCOPIC
Leukocytes, UA: NEGATIVE
NITRITE: NEGATIVE
PH: 5.5 (ref 5.0–8.0)
Specific Gravity, Urine: 1.025 (ref 1.000–1.030)
TOTAL PROTEIN, URINE-UPE24: NEGATIVE
URINE GLUCOSE: NEGATIVE
Urobilinogen, UA: 0.2 (ref 0.0–1.0)

## 2013-11-28 LAB — LIPID PANEL
CHOLESTEROL: 158 mg/dL (ref 0–200)
HDL: 44.7 mg/dL (ref >39.00)
LDL Cholesterol: 100 mg/dL — ABNORMAL HIGH (ref 0–99)
NonHDL: 113.3
Total CHOL/HDL Ratio: 4
Triglycerides: 67 mg/dL (ref 0.0–149.0)
VLDL: 13.4 mg/dL (ref 0.0–40.0)

## 2013-11-28 LAB — TESTOSTERONE, FREE, TOTAL, SHBG
SEX HORMONE BINDING: 48 nmol/L (ref 13–71)
Testosterone, Free: 28.4 pg/mL — ABNORMAL LOW (ref 47.0–244.0)
Testosterone-% Free: 1.5 % — ABNORMAL LOW (ref 1.6–2.9)
Testosterone: 191 ng/dL — ABNORMAL LOW (ref 300–890)

## 2013-11-28 LAB — COMPREHENSIVE METABOLIC PANEL
ALT: 15 U/L (ref 0–53)
AST: 21 U/L (ref 0–37)
Albumin: 4.1 g/dL (ref 3.5–5.2)
Alkaline Phosphatase: 50 U/L (ref 39–117)
BILIRUBIN TOTAL: 0.4 mg/dL (ref 0.2–1.2)
BUN: 12 mg/dL (ref 6–23)
CALCIUM: 9.4 mg/dL (ref 8.4–10.5)
CHLORIDE: 101 meq/L (ref 96–112)
CO2: 30 meq/L (ref 19–32)
CREATININE: 0.9 mg/dL (ref 0.4–1.5)
GFR: 95.13 mL/min (ref >60.00)
GLUCOSE: 94 mg/dL (ref 70–99)
Potassium: 4.8 mEq/L (ref 3.5–5.1)
Sodium: 137 mEq/L (ref 135–145)
TOTAL PROTEIN: 6.9 g/dL (ref 6.0–8.3)

## 2013-11-28 LAB — VITAMIN D 25 HYDROXY (VIT D DEFICIENCY, FRACTURES): VITD: 39.13 ng/mL (ref 30.00–100.00)

## 2013-11-28 LAB — CBC WITH DIFFERENTIAL/PLATELET
Basophils Absolute: 0 10*3/uL (ref 0.0–0.1)
Basophils Relative: 0.2 % (ref 0.0–3.0)
EOS PCT: 0 % (ref 0.0–5.0)
Eosinophils Absolute: 0 10*3/uL (ref 0.0–0.7)
HEMATOCRIT: 39.1 % (ref 39.0–52.0)
HEMOGLOBIN: 13.3 g/dL (ref 13.0–17.0)
LYMPHS ABS: 0.6 10*3/uL — AB (ref 0.7–4.0)
Lymphocytes Relative: 8.1 % — ABNORMAL LOW (ref 12.0–46.0)
MCHC: 34 g/dL (ref 30.0–36.0)
MCV: 96.9 fl (ref 78.0–100.0)
MONOS PCT: 0.8 % — AB (ref 3.0–12.0)
Monocytes Absolute: 0.1 10*3/uL (ref 0.1–1.0)
NEUTROS ABS: 6.7 10*3/uL (ref 1.4–7.7)
Neutrophils Relative %: 90.9 % — ABNORMAL HIGH (ref 43.0–77.0)
Platelets: 252 10*3/uL (ref 150.0–400.0)
RBC: 4.04 Mil/uL — AB (ref 4.22–5.81)
RDW: 14.9 % (ref 11.5–15.5)
WBC: 7.4 10*3/uL (ref 4.0–10.5)

## 2013-11-28 LAB — MICROALBUMIN / CREATININE URINE RATIO
Creatinine,U: 545.3 mg/dL
MICROALB UR: 1.5 mg/dL (ref 0.0–1.9)
MICROALB/CREAT RATIO: 0.3 mg/g (ref 0.0–30.0)

## 2013-11-28 LAB — T4, FREE: FREE T4: 0.86 ng/dL (ref 0.60–1.60)

## 2013-11-28 LAB — PSA, MEDICARE: PSA: 0.3 ng/ml (ref 0.10–4.00)

## 2013-11-28 LAB — TSH: TSH: 0.19 u[IU]/mL — AB (ref 0.35–4.50)

## 2013-12-13 DIAGNOSIS — N411 Chronic prostatitis: Secondary | ICD-10-CM | POA: Insufficient documentation

## 2013-12-13 DIAGNOSIS — R339 Retention of urine, unspecified: Secondary | ICD-10-CM | POA: Insufficient documentation

## 2013-12-13 DIAGNOSIS — R3129 Other microscopic hematuria: Secondary | ICD-10-CM | POA: Insufficient documentation

## 2014-02-26 ENCOUNTER — Encounter: Payer: Self-pay | Admitting: Internal Medicine

## 2014-02-26 ENCOUNTER — Ambulatory Visit (INDEPENDENT_AMBULATORY_CARE_PROVIDER_SITE_OTHER): Payer: Medicare Other | Admitting: Internal Medicine

## 2014-02-26 VITALS — BP 102/59 | HR 68 | Temp 98.3°F | Ht 70.5 in | Wt 166.5 lb

## 2014-02-26 DIAGNOSIS — F4310 Post-traumatic stress disorder, unspecified: Secondary | ICD-10-CM | POA: Insufficient documentation

## 2014-02-26 DIAGNOSIS — Z1211 Encounter for screening for malignant neoplasm of colon: Secondary | ICD-10-CM

## 2014-02-26 DIAGNOSIS — Z0000 Encounter for general adult medical examination without abnormal findings: Secondary | ICD-10-CM | POA: Insufficient documentation

## 2014-02-26 MED ORDER — SERTRALINE HCL 25 MG PO TABS: 25.0000 mg | ORAL_TABLET | Freq: Every day | ORAL | Status: DC

## 2014-02-26 NOTE — Patient Instructions (Addendum)
Start Sertraline 25mg  daily at bedtime. Follow up in 4 weeks or sooner as needed.   Health Maintenance A healthy lifestyle and preventative care can promote health and wellness.  Maintain regular health, dental, and eye exams.  Eat a healthy diet. Foods like vegetables, fruits, whole grains, low-fat dairy products, and lean protein foods contain the nutrients you need and are low in calories. Decrease your intake of foods high in solid fats, added sugars, and salt. Get information about a proper diet from your health care provider, if necessary.  Regular physical exercise is one of the most important things you can do for your health. Most adults should get at least 150 minutes of moderate-intensity exercise (any activity that increases your heart rate and causes you to sweat) each week. In addition, most adults need muscle-strengthening exercises on 2 or more days a week.   Maintain a healthy weight. The body mass index (BMI) is a screening tool to identify possible weight problems. It provides an estimate of body fat based on height and weight. Your health care provider can find your BMI and can help you achieve or maintain a healthy weight. For males 20 years and older:  A BMI below 18.5 is considered underweight.  A BMI of 18.5 to 24.9 is normal.  A BMI of 25 to 29.9 is considered overweight.  A BMI of 30 and above is considered obese.  Maintain normal blood lipids and cholesterol by exercising and minimizing your intake of saturated fat. Eat a balanced diet with plenty of fruits and vegetables. Blood tests for lipids and cholesterol should begin at age 30 and be repeated every 5 years. If your lipid or cholesterol levels are high, you are over age 62, or you are at high risk for heart disease, you may need your cholesterol levels checked more frequently.Ongoing high lipid and cholesterol levels should be treated with medicines if diet and exercise are not working.  If you smoke, find  out from your health care provider how to quit. If you do not use tobacco, do not start.  Lung cancer screening is recommended for adults aged 68-80 years who are at high risk for developing lung cancer because of a history of smoking. A yearly low-dose CT scan of the lungs is recommended for people who have at least a 30-pack-year history of smoking and are current smokers or have quit within the past 15 years. A pack year of smoking is smoking an average of 1 pack of cigarettes a day for 1 year (for example, a 30-pack-year history of smoking could mean smoking 1 pack a day for 30 years or 2 packs a day for 15 years). Yearly screening should continue until the smoker has stopped smoking for at least 15 years. Yearly screening should be stopped for people who develop a health problem that would prevent them from having lung cancer treatment.  If you choose to drink alcohol, do not have more than 2 drinks per day. One drink is considered to be 12 oz (360 mL) of beer, 5 oz (150 mL) of wine, or 1.5 oz (45 mL) of liquor.  Avoid the use of street drugs. Do not share needles with anyone. Ask for help if you need support or instructions about stopping the use of drugs.  High blood pressure causes heart disease and increases the risk of stroke. Blood pressure should be checked at least every 1-2 years. Ongoing high blood pressure should be treated with medicines if weight loss and  exercise are not effective.  If you are 8-64 years old, ask your health care provider if you should take aspirin to prevent heart disease.  Diabetes screening involves taking a blood sample to check your fasting blood sugar level. This should be done once every 3 years after age 24 if you are at a normal weight and without risk factors for diabetes. Testing should be considered at a younger age or be carried out more frequently if you are overweight and have at least 1 risk factor for diabetes.  Colorectal cancer can be detected and  often prevented. Most routine colorectal cancer screening begins at the age of 42 and continues through age 62. However, your health care provider may recommend screening at an earlier age if you have risk factors for colon cancer. On a yearly basis, your health care provider may provide home test kits to check for hidden blood in the stool. A small camera at the end of a tube may be used to directly examine the colon (sigmoidoscopy or colonoscopy) to detect the earliest forms of colorectal cancer. Talk to your health care provider about this at age 69 when routine screening begins. A direct exam of the colon should be repeated every 5-10 years through age 34, unless early forms of precancerous polyps or small growths are found.  People who are at an increased risk for hepatitis B should be screened for this virus. You are considered at high risk for hepatitis B if:  You were born in a country where hepatitis B occurs often. Talk with your health care provider about which countries are considered high risk.  Your parents were born in a high-risk country and you have not received a shot to protect against hepatitis B (hepatitis B vaccine).  You have HIV or AIDS.  You use needles to inject street drugs.  You live with, or have sex with, someone who has hepatitis B.  You are a man who has sex with other men (MSM).  You get hemodialysis treatment.  You take certain medicines for conditions like cancer, organ transplantation, and autoimmune conditions.  Hepatitis C blood testing is recommended for all people born from [birth year] through 1964 and any individual with known risk factors for hepatitis C.  Healthy men should no longer receive prostate-specific antigen (PSA) blood tests as part of routine cancer screening. Talk to your health care provider about prostate cancer screening.  Testicular cancer screening is not recommended for adolescents or adult males who have no symptoms. Screening includes  self-exam, a health care provider exam, and other screening tests. Consult with your health care provider about any symptoms you have or any concerns you have about testicular cancer.  Practice safe sex. Use condoms and avoid high-risk sexual practices to reduce the spread of sexually transmitted infections (STIs).  You should be screened for STIs, including gonorrhea and chlamydia if:  You are sexually active and are younger than 24 years.  You are older than 24 years, and your health care provider tells you that you are at risk for this type of infection.  Your sexual activity has changed since you were last screened, and you are at an increased risk for chlamydia or gonorrhea. Ask your health care provider if you are at risk.  If you are at risk of being infected with HIV, it is recommended that you take a prescription medicine daily to prevent HIV infection. This is called pre-exposure prophylaxis (PrEP). You are considered at risk if:  You  are a man who has sex with other men (MSM).  You are a heterosexual man who is sexually active with multiple partners.  You take drugs by injection.  You are sexually active with a partner who has HIV.  Talk with your health care provider about whether you are at high risk of being infected with HIV. If you choose to begin PrEP, you should first be tested for HIV. You should then be tested every 3 months for as long as you are taking PrEP.  Use sunscreen. Apply sunscreen liberally and repeatedly throughout the day. You should seek shade when your shadow is shorter than you. Protect yourself by wearing long sleeves, pants, a wide-brimmed hat, and sunglasses year round whenever you are outdoors.  Tell your health care provider of new moles or changes in moles, especially if there is a change in shape or color. Also, tell your health care provider if a mole is larger than the size of a pencil eraser.  A one-time screening for abdominal aortic aneurysm  (AAA) and surgical repair of large AAAs by ultrasound is recommended for men aged 14-75 years who are current or former smokers.  Stay current with your vaccines (immunizations). Document Released: 08/15/2007 Document Revised: 02/21/2013 Document Reviewed: 07/14/2010 [facility] Patient Information 2015 [place], [place]. This information is not intended to replace advice given to you by your health care provider. Make sure you discuss any questions you have with your health care provider.

## 2014-02-26 NOTE — Assessment & Plan Note (Signed)
Symptoms consistent with PTSD. Will start Sertraline 25mg  po at bedtime. Follow up in 4 weeks or sooner as needed.

## 2014-02-26 NOTE — Progress Notes (Signed)
Pre visit review using our clinic review tool, if applicable. No additional management support is needed unless otherwise documented below in the visit note. 

## 2014-02-26 NOTE — Assessment & Plan Note (Signed)
Will set up screening colonoscopy. 

## 2014-02-26 NOTE — Progress Notes (Signed)
The patient is here for annual Medicare Wellness Examination and management of other chronic and acute problems.   The risk factors are reflected in the history.  The roster of all physicians providing medical care to patient - is listed in the Snapshot section of the chart.  Activities of daily living:   The patient is 100% independent in all ADLs: dressing, toileting, feeding as well as independent mobility. Patient lives with mother (52YO) and brother in a home. On disability. He manages his mother's care. Mother has dementia. Brother is bipolar and has sleep disorder. Two story home. Lives mostly upstairs. Hardwood floors. Has 3 dogs.  Home safety :  The patient has planned to get smoke detectors in the home.  They wear seatbelts in their car. There are no firearms at home.  There is no violence in the home. They feel safe where they live.  Infectious Risks: There is no risks for hepatitis, STDs or HIV.  There is no  history of blood transfusion.  They have no travel history to infectious disease endemic areas of the world.  Additional Health Care Providers: The patient has seen their dentist in the last six months. Dentist - Dr. Sharlett Iles They have seen their eye doctor in the last year. Opthalmologist - years ago They deny hearing issues. They have deferred audiologic testing in the last year.   They do not  have excessive sun exposure. Discussed the need for sun protection: hats,long sleeves and use of sunscreen if there is significant sun exposure.  Dermatologist - Dr. Allyson Sabal  Urologist - Dr. Jacqlyn Larsen  Diet: the importance of a healthy diet is discussed. They do have a healthy diet.  The benefits of regular aerobic exercise were discussed. Exercise limited by chronic pain, but he is trying to exercise daily.  Depression screen: there are symptoms of depression- irritability, change in appetite, anhedonia, sadness/tearfullness. PHQ -9 - 18 Describes depression as "crushing."   Told in past he has post-traumatic stress. Has recurrent nightmares.  Cognitive assessment: the patient manages all their financial and personal affairs and is actively engaged. They could relate day,date,year and events.  The following portions of the patient's history were reviewed and updated as appropriate: allergies, current medications, past family history, past medical history,  past surgical history, past social history and problem list.  Visual acuity was not assessed per patient preference as they have regular follow up with their ophthalmologist. Hearing and body mass index were assessed and reviewed.   During the course of the visit the patient was educated and counseled about appropriate screening and preventive services including : fall prevention , diabetes screening, nutrition counseling, colorectal cancer screening, and recommended immunizations.     FOLLOW UP: Recently seen by Dr. Jacqlyn Larsen in Urology. US showed 1.1cm calcification in prostate. Planning to do cystoscopy in the future.  Review of Systems  Constitutional: Negative for fever, chills, activity change, appetite change, fatigue and unexpected weight change.  Eyes: Negative for visual disturbance.  Respiratory: Negative for cough, chest tightness and shortness of breath.   Cardiovascular: Negative for chest pain, palpitations and leg swelling.  Gastrointestinal: Negative for nausea, vomiting, abdominal pain, diarrhea, constipation and abdominal distention.  Genitourinary: Negative for dysuria, urgency and difficulty urinating.  Musculoskeletal: Positive for myalgias, back pain and arthralgias. Negative for gait problem.  Skin: Negative for color change and rash.  Hematological: Negative for adenopathy.  Psychiatric/Behavioral: Positive for sleep disturbance and dysphoric mood. Negative for suicidal ideas. The patient is not nervous/anxious.  Objective:    BP 102/59 mmHg  Pulse 68  Temp(Src) 98.3 F (36.8  C) (Oral)  Ht 5' 10.5" (1.791 m)  Wt 166 lb 8 oz (75.524 kg)  BMI 23.54 kg/m2  SpO2 95% Physical Exam  Constitutional: He is oriented to person, place, and time. He appears well-developed and well-nourished. No distress.  HENT:  Head: Normocephalic and atraumatic.  Right Ear: External ear normal.  Left Ear: External ear normal.  Nose: Nose normal.  Mouth/Throat: Oropharynx is clear and moist. No oropharyngeal exudate.  Eyes: Conjunctivae and EOM are normal. Pupils are equal, round, and reactive to light. Right eye exhibits no discharge. Left eye exhibits no discharge. No scleral icterus.  Neck: Normal range of motion. Neck supple. No tracheal deviation present. No thyromegaly present.  Cardiovascular: Normal rate, regular rhythm and normal heart sounds.  Exam reveals no gallop and no friction rub.   No murmur heard. Pulmonary/Chest: Effort normal. No accessory muscle usage. No tachypnea. No respiratory distress. He has no decreased breath sounds. He has no wheezes. He has no rhonchi. He has rales (few scattered right base). He exhibits no tenderness.  Musculoskeletal: He exhibits no edema.       Cervical back: He exhibits decreased range of motion and pain.  Lymphadenopathy:    He has no cervical adenopathy.  Neurological: He is alert and oriented to person, place, and time. No cranial nerve deficit. Coordination normal.  Skin: Skin is warm and dry. No rash noted. He is not diaphoretic. No erythema. No pallor.  Psychiatric: His speech is normal and behavior is normal. Judgment and thought content normal. Cognition and memory are normal. He exhibits a depressed mood. He expresses no suicidal ideation. He expresses no suicidal plans.          Assessment & Plan:   Problem List Items Addressed This Visit      Unprioritized   Medicare annual wellness visit, subsequent - Primary    General medical exam normal except as noted. Colonoscopy ordered. Reviewed notes from urology. Reviewed  recent labs. Immunizations UTD. Encouraged continued healthy diet and exercise. Encouraged smoking cessation.    PTSD (post-traumatic stress disorder)    Symptoms consistent with PTSD. Will start Sertraline 25mg  po at bedtime. Follow up in 4 weeks or sooner as needed.    Relevant Medications      sertraline (ZOLOFT) tablet   Screening for colon cancer    Will set up screening colonoscopy.    Relevant Orders      Ambulatory referral to Gastroenterology       Return in about 4 weeks (around 03/26/2014) for Recheck.

## 2014-02-26 NOTE — Assessment & Plan Note (Signed)
General medical exam normal except as noted. Colonoscopy ordered. Reviewed notes from urology. Reviewed recent labs. Immunizations UTD. Encouraged continued healthy diet and exercise. Encouraged smoking cessation.

## 2014-02-27 ENCOUNTER — Telehealth: Payer: Self-pay | Admitting: Internal Medicine

## 2014-02-27 NOTE — Telephone Encounter (Signed)
emmi mailed  °

## 2014-03-14 DIAGNOSIS — M545 Low back pain: Secondary | ICD-10-CM | POA: Diagnosis not present

## 2014-03-14 DIAGNOSIS — E291 Testicular hypofunction: Secondary | ICD-10-CM | POA: Diagnosis not present

## 2014-03-14 DIAGNOSIS — M542 Cervicalgia: Secondary | ICD-10-CM | POA: Diagnosis not present

## 2014-03-14 DIAGNOSIS — M5030 Other cervical disc degeneration, unspecified cervical region: Secondary | ICD-10-CM | POA: Diagnosis not present

## 2014-03-29 ENCOUNTER — Encounter: Payer: Self-pay | Admitting: Internal Medicine

## 2014-03-29 ENCOUNTER — Ambulatory Visit (INDEPENDENT_AMBULATORY_CARE_PROVIDER_SITE_OTHER): Payer: Medicare Other | Admitting: Internal Medicine

## 2014-03-29 VITALS — BP 94/59 | HR 78 | Temp 98.0°F | Ht 70.5 in | Wt 169.5 lb

## 2014-03-29 DIAGNOSIS — G4700 Insomnia, unspecified: Secondary | ICD-10-CM | POA: Diagnosis not present

## 2014-03-29 DIAGNOSIS — F4310 Post-traumatic stress disorder, unspecified: Secondary | ICD-10-CM | POA: Diagnosis not present

## 2014-03-29 MED ORDER — ZALEPLON 5 MG PO CAPS: 5.0000 mg | ORAL_CAPSULE | Freq: Every evening | ORAL | Status: DC | PRN

## 2014-03-29 MED ORDER — SERTRALINE HCL 50 MG PO TABS: 50.0000 mg | ORAL_TABLET | Freq: Every day | ORAL | Status: DC

## 2014-03-29 NOTE — Progress Notes (Signed)
Pre visit review using our clinic review tool, if applicable. No additional management support is needed unless otherwise documented below in the visit note. 

## 2014-03-29 NOTE — Patient Instructions (Signed)
Increase Sertraline to 50mg  daily.  Add Sonata 5mg  at bedtime to help with sleep.   Follow up in 4 weeks or sooner as needed.

## 2014-03-29 NOTE — Assessment & Plan Note (Signed)
Discussed risks of adding sedating medications in combination with narcotic pain medications. However insomnia having significant impact on quality of life. Will try low dose of Sonata. Will also increase Sertraline. Follow up by phone Monday and in a visit in 4 weeks.

## 2014-03-29 NOTE — Progress Notes (Signed)
Subjective:    Patient ID: Jared Farley, male    DOB: 28-Oct-1960, 54 y.o.   MRN: [id number]  HPI 54YO male presents for follow up.  Last seen 12/28 - Started on Sertraline to help with PTSD symptoms.  No improvement noted with Sertraline. No effects whatsoever.  Having trouble sleeping. Cannot sleep more than 2-3 hr at a time. Feels exhausted during day, trouble concentrating.  Decreased dose of Prednisone a few weeks ago to 10mg  daily. Having increased pain, increased stiffness, poor sleep with this. Continues to follow up at pain management.    Past medical, surgical, family and social history per today's encounter.  Review of Systems  Constitutional: Negative for fever, chills, activity change, appetite change, fatigue and unexpected weight change.  Eyes: Negative for visual disturbance.  Respiratory: Negative for cough and shortness of breath.   Cardiovascular: Negative for chest pain, palpitations and leg swelling.  Gastrointestinal: Negative for abdominal pain and abdominal distention.  Genitourinary: Negative for dysuria, urgency and difficulty urinating.  Musculoskeletal: Positive for myalgias, back pain, arthralgias and gait problem.  Skin: Negative for color change and rash.  Hematological: Negative for adenopathy.  Psychiatric/Behavioral: Positive for sleep disturbance, dysphoric mood and decreased concentration. Negative for suicidal ideas. The patient is nervous/anxious.        Objective:    BP 94/59 mmHg  Pulse 78  Temp(Src) 98 F (36.7 C) (Oral)  Ht 5' 10.5" (1.791 m)  Wt 169 lb 8 oz (76.885 kg)  BMI 23.97 kg/m2  SpO2 96% Physical Exam  Constitutional: He is oriented to person, place, and time. He appears well-developed and well-nourished. No distress.  HENT:  Head: Normocephalic and atraumatic.  Right Ear: External ear normal.  Left Ear: External ear normal.  Nose: Nose normal.  Mouth/Throat: Oropharynx is clear and moist. No oropharyngeal  exudate.  Eyes: Conjunctivae and EOM are normal. Pupils are equal, round, and reactive to light. Right eye exhibits no discharge. Left eye exhibits no discharge. No scleral icterus.  Neck: Normal range of motion. Neck supple. No tracheal deviation present. No thyromegaly present.  Cardiovascular: Normal rate, regular rhythm and normal heart sounds.  Exam reveals no gallop and no friction rub.   No murmur heard. Pulmonary/Chest: Effort normal and breath sounds normal. No accessory muscle usage. No tachypnea. No respiratory distress. He has no decreased breath sounds. He has no wheezes. He has no rhonchi. He has no rales. He exhibits no tenderness.  Musculoskeletal: Normal range of motion. He exhibits no edema.  Lymphadenopathy:    He has no cervical adenopathy.  Neurological: He is alert and oriented to person, place, and time. No cranial nerve deficit. Coordination normal.  Skin: Skin is warm and dry. No rash noted. He is not diaphoretic. No erythema. No pallor.  Psychiatric: His speech is normal and behavior is normal. Judgment and thought content normal. His mood appears anxious. Cognition and memory are normal. He exhibits a depressed mood. He expresses no suicidal ideation.          Assessment & Plan:   Problem List Items Addressed This Visit      Unprioritized   Insomnia    Discussed risks of adding sedating medications in combination with narcotic pain medications. However insomnia having significant impact on quality of life. Will try low dose of Sonata. Will also increase Sertraline. Follow up by phone Monday and in a visit in 4 weeks.      Relevant Medications   zaleplon (SONATA) capsule  PTSD (post-traumatic stress disorder) - Primary    Worsening anxiety and depression related to PTSD, chronic pain and chronic insomnia. Added Sertraline last visit. Will increase today to 50mg  daily. Will also set up counseling. Follow up in 4 weeks and prn.      Relevant Medications    sertraline (ZOLOFT) tablet   Other Relevant Orders   Ambulatory referral to Psychology       Return in about 4 weeks (around 04/26/2014) for Recheck.

## 2014-03-29 NOTE — Assessment & Plan Note (Signed)
Worsening anxiety and depression related to PTSD, chronic pain and chronic insomnia. Added Sertraline last visit. Will increase today to 50mg  daily. Will also set up counseling. Follow up in 4 weeks and prn.

## 2014-05-02 ENCOUNTER — Ambulatory Visit: Payer: Medicare Other | Admitting: Internal Medicine

## 2014-05-10 DIAGNOSIS — M542 Cervicalgia: Secondary | ICD-10-CM | POA: Diagnosis not present

## 2014-05-10 DIAGNOSIS — E291 Testicular hypofunction: Secondary | ICD-10-CM | POA: Diagnosis not present

## 2014-05-10 DIAGNOSIS — M545 Low back pain: Secondary | ICD-10-CM | POA: Diagnosis not present

## 2014-05-10 DIAGNOSIS — M5030 Other cervical disc degeneration, unspecified cervical region: Secondary | ICD-10-CM | POA: Diagnosis not present

## 2014-06-19 ENCOUNTER — Encounter: Payer: Self-pay | Admitting: Internal Medicine

## 2014-06-19 ENCOUNTER — Ambulatory Visit (INDEPENDENT_AMBULATORY_CARE_PROVIDER_SITE_OTHER): Payer: Medicare Other | Admitting: Internal Medicine

## 2014-06-19 VITALS — BP 94/60 | HR 70 | Temp 98.0°F | Ht 70.5 in | Wt 166.4 lb

## 2014-06-19 DIAGNOSIS — G4700 Insomnia, unspecified: Secondary | ICD-10-CM

## 2014-06-19 DIAGNOSIS — H65191 Other acute nonsuppurative otitis media, right ear: Secondary | ICD-10-CM

## 2014-06-19 DIAGNOSIS — H60399 Other infective otitis externa, unspecified ear: Secondary | ICD-10-CM | POA: Insufficient documentation

## 2014-06-19 DIAGNOSIS — F4310 Post-traumatic stress disorder, unspecified: Secondary | ICD-10-CM

## 2014-06-19 DIAGNOSIS — H60391 Other infective otitis externa, right ear: Secondary | ICD-10-CM | POA: Diagnosis not present

## 2014-06-19 MED ORDER — CIPROFLOXACIN-DEXAMETHASONE 0.3-0.1 % OT SUSP: 4.0000 [drp] | Freq: Two times a day (BID) | OTIC | Status: DC

## 2014-06-19 MED ORDER — SERTRALINE HCL 100 MG PO TABS: 100.0000 mg | ORAL_TABLET | Freq: Every day | ORAL | Status: DC

## 2014-06-19 MED ORDER — ZALEPLON 5 MG PO CAPS
15.0000 mg | ORAL_CAPSULE | Freq: Every evening | ORAL | Status: DC | PRN
Start: 2014-06-19 — End: 2015-05-17

## 2014-06-19 MED ORDER — AMOXICILLIN-POT CLAVULANATE 875-125 MG PO TABS: 1.0000 | ORAL_TABLET | Freq: Two times a day (BID) | ORAL | Status: DC

## 2014-06-19 NOTE — Patient Instructions (Addendum)
Increase Sertraline to 100mg  daily.  Continue Sonata 15mg  at bedtime.  Start Augmentin for right ear infection. Start Ciprodex ear drops.

## 2014-06-19 NOTE — Progress Notes (Signed)
Pre visit review using our clinic review tool, if applicable. No additional management support is needed unless otherwise documented below in the visit note. 

## 2014-06-19 NOTE — Assessment & Plan Note (Signed)
Right ear canal erythematous and tender to palpation. Will treat with topical CiproDex. Follow up recheck in 4 weeks.

## 2014-06-19 NOTE — Progress Notes (Signed)
Subjective:    Patient ID: Jared Farley, male    DOB: Oct 10, 1960, 54 y.o.   MRN: [id number]  HPI  54YO male presents for follow up.  Last seen 03/2014. Increased Sertraline to 50mg  daily.  Insomnia improved with taking Sonata 15mg  at bedtime.  No improvement in symptoms of depressed mood, apathy with Sertraline. Continues to have difficult home situation, caring for his mother and brother.  Past medical, surgical, family and social history per today's encounter.  Review of Systems  Constitutional: Negative for fever, chills, activity change, appetite change, fatigue and unexpected weight change.  HENT: Positive for ear pain. Negative for congestion, ear discharge, hearing loss, rhinorrhea, sinus pressure, sore throat, trouble swallowing and voice change.   Eyes: Negative for visual disturbance.  Respiratory: Negative for cough and shortness of breath.   Cardiovascular: Negative for chest pain, palpitations and leg swelling.  Gastrointestinal: Negative for abdominal pain, diarrhea, constipation and abdominal distention.  Genitourinary: Negative for dysuria, urgency and difficulty urinating.  Musculoskeletal: Negative for arthralgias and gait problem.  Skin: Negative for color change and rash.  Hematological: Negative for adenopathy.  Psychiatric/Behavioral: Positive for sleep disturbance and dysphoric mood. Negative for suicidal ideas. The patient is nervous/anxious.        Objective:    BP 94/60 mmHg  Pulse 70  Temp(Src) 98 F (36.7 C) (Oral)  Ht 5' 10.5" (1.791 m)  Wt 166 lb 6 oz (75.467 kg)  BMI 23.53 kg/m2  SpO2 95% Physical Exam  Constitutional: He is oriented to person, place, and time. He appears well-developed and well-nourished. No distress.  HENT:  Head: Normocephalic and atraumatic.  Right Ear: External ear normal. Tympanic membrane is erythematous and bulging. A middle ear effusion is present.  Left Ear: Tympanic membrane, external ear and ear canal normal.   Nose: Nose normal.  Mouth/Throat: Oropharynx is clear and moist. No oropharyngeal exudate.  Right ear canal erythematous with tenderness to palpitation  Eyes: Conjunctivae and EOM are normal. Pupils are equal, round, and reactive to light. Right eye exhibits no discharge. Left eye exhibits no discharge. No scleral icterus.  Neck: Normal range of motion. Neck supple. No tracheal deviation present. No thyromegaly present.  Cardiovascular: Normal rate, regular rhythm and normal heart sounds.  Exam reveals no gallop and no friction rub.   No murmur heard. Pulmonary/Chest: Effort normal and breath sounds normal. No accessory muscle usage. No tachypnea. No respiratory distress. He has no decreased breath sounds. He has no wheezes. He has no rhonchi. He has no rales. He exhibits no tenderness.  Musculoskeletal: Normal range of motion. He exhibits no edema.  Lymphadenopathy:    He has no cervical adenopathy.  Neurological: He is alert and oriented to person, place, and time. No cranial nerve deficit. Coordination normal.  Skin: Skin is warm and dry. No rash noted. He is not diaphoretic. No erythema. No pallor.  Psychiatric: His behavior is normal. Judgment and thought content normal. His mood appears anxious. Cognition and memory are normal. He exhibits a depressed mood. He expresses no suicidal ideation.          Assessment & Plan:   Problem List Items Addressed This Visit      Unprioritized   Acute nonsuppurative otitis media of right ear    Exam consistent with OM. Will start Augmentin. Follow up recheck in 4 weeks or sooner as needed.      Relevant Medications   amoxicillin-clavulanate (AUGMENTIN) 875-125 MG per tablet   Insomnia  Symptoms improved with Sonata 15mg  daily. Will continue.      Relevant Medications   zaleplon (SONATA) 5 MG capsule   Otitis, externa, infective    Right ear canal erythematous and tender to palpation. Will treat with topical CiproDex. Follow up  recheck in 4 weeks.      Relevant Medications   ciprofloxacin-dexamethasone (CIPRODEX) otic suspension   PTSD (post-traumatic stress disorder) - Primary    Symptoms poorly controlled with persistent anxiety and depression on current dose of Sertraline. Will increase Sertraline to 100mg  daily. Follow up in 4 weeks and prn. Encouraged him to call Behavioral Health to reschedule counseling appt.      Relevant Medications   sertraline (ZOLOFT) 100 MG tablet       Return in about 4 weeks (around 07/17/2014) for Recheck.

## 2014-06-19 NOTE — Assessment & Plan Note (Signed)
Symptoms poorly controlled with persistent anxiety and depression on current dose of Sertraline. Will increase Sertraline to 100mg  daily. Follow up in 4 weeks and prn. Encouraged him to call Behavioral Health to reschedule counseling appt.

## 2014-06-19 NOTE — Assessment & Plan Note (Signed)
Exam consistent with OM. Will start Augmentin. Follow up recheck in 4 weeks or sooner as needed.

## 2014-06-19 NOTE — Assessment & Plan Note (Signed)
Symptoms improved with Sonata 15mg  daily. Will continue.

## 2014-07-06 DIAGNOSIS — M542 Cervicalgia: Secondary | ICD-10-CM | POA: Diagnosis not present

## 2014-07-06 DIAGNOSIS — M545 Low back pain: Secondary | ICD-10-CM | POA: Diagnosis not present

## 2014-07-06 DIAGNOSIS — M5030 Other cervical disc degeneration, unspecified cervical region: Secondary | ICD-10-CM | POA: Diagnosis not present

## 2014-07-06 DIAGNOSIS — M5136 Other intervertebral disc degeneration, lumbar region: Secondary | ICD-10-CM | POA: Diagnosis not present

## 2014-07-31 ENCOUNTER — Ambulatory Visit: Payer: Medicare Other | Admitting: Internal Medicine

## 2014-07-31 DIAGNOSIS — Z0289 Encounter for other administrative examinations: Secondary | ICD-10-CM

## 2014-08-28 DIAGNOSIS — E118 Type 2 diabetes mellitus with unspecified complications: Secondary | ICD-10-CM | POA: Diagnosis not present

## 2014-08-28 DIAGNOSIS — J45909 Unspecified asthma, uncomplicated: Secondary | ICD-10-CM | POA: Diagnosis not present

## 2014-08-28 DIAGNOSIS — R339 Retention of urine, unspecified: Secondary | ICD-10-CM | POA: Diagnosis not present

## 2014-08-28 DIAGNOSIS — N411 Chronic prostatitis: Secondary | ICD-10-CM | POA: Diagnosis not present

## 2014-08-28 DIAGNOSIS — R312 Other microscopic hematuria: Secondary | ICD-10-CM | POA: Diagnosis not present

## 2014-08-28 DIAGNOSIS — F17210 Nicotine dependence, cigarettes, uncomplicated: Secondary | ICD-10-CM | POA: Diagnosis not present

## 2014-08-28 DIAGNOSIS — J449 Chronic obstructive pulmonary disease, unspecified: Secondary | ICD-10-CM | POA: Diagnosis not present

## 2014-08-28 DIAGNOSIS — N400 Enlarged prostate without lower urinary tract symptoms: Secondary | ICD-10-CM | POA: Diagnosis not present

## 2014-08-28 DIAGNOSIS — E291 Testicular hypofunction: Secondary | ICD-10-CM | POA: Diagnosis not present

## 2014-08-28 DIAGNOSIS — N138 Other obstructive and reflux uropathy: Secondary | ICD-10-CM | POA: Diagnosis not present

## 2014-08-28 DIAGNOSIS — N401 Enlarged prostate with lower urinary tract symptoms: Secondary | ICD-10-CM | POA: Diagnosis not present

## 2014-09-05 DIAGNOSIS — M542 Cervicalgia: Secondary | ICD-10-CM | POA: Diagnosis not present

## 2014-09-05 DIAGNOSIS — M5136 Other intervertebral disc degeneration, lumbar region: Secondary | ICD-10-CM | POA: Diagnosis not present

## 2014-09-05 DIAGNOSIS — G4709 Other insomnia: Secondary | ICD-10-CM | POA: Diagnosis not present

## 2014-09-05 DIAGNOSIS — F331 Major depressive disorder, recurrent, moderate: Secondary | ICD-10-CM | POA: Diagnosis not present

## 2014-09-05 DIAGNOSIS — M545 Low back pain: Secondary | ICD-10-CM | POA: Diagnosis not present

## 2014-11-06 DIAGNOSIS — M5136 Other intervertebral disc degeneration, lumbar region: Secondary | ICD-10-CM | POA: Diagnosis not present

## 2014-11-06 DIAGNOSIS — M542 Cervicalgia: Secondary | ICD-10-CM | POA: Diagnosis not present

## 2014-11-06 DIAGNOSIS — M5030 Other cervical disc degeneration, unspecified cervical region: Secondary | ICD-10-CM | POA: Diagnosis not present

## 2014-11-06 DIAGNOSIS — M545 Low back pain: Secondary | ICD-10-CM | POA: Diagnosis not present

## 2014-12-11 ENCOUNTER — Other Ambulatory Visit: Payer: Self-pay

## 2014-12-11 DIAGNOSIS — J40 Bronchitis, not specified as acute or chronic: Secondary | ICD-10-CM

## 2014-12-11 MED ORDER — ALBUTEROL SULFATE HFA 108 (90 BASE) MCG/ACT IN AERS: 2.0000 | INHALATION_SPRAY | Freq: Four times a day (QID) | RESPIRATORY_TRACT | Status: DC | PRN

## 2014-12-11 NOTE — Telephone Encounter (Signed)
Please advise Refill as cancelled last visit?

## 2015-01-01 DIAGNOSIS — M542 Cervicalgia: Secondary | ICD-10-CM | POA: Diagnosis not present

## 2015-01-01 DIAGNOSIS — M5136 Other intervertebral disc degeneration, lumbar region: Secondary | ICD-10-CM | POA: Diagnosis not present

## 2015-01-01 DIAGNOSIS — M545 Low back pain: Secondary | ICD-10-CM | POA: Diagnosis not present

## 2015-01-01 DIAGNOSIS — M5030 Other cervical disc degeneration, unspecified cervical region: Secondary | ICD-10-CM | POA: Diagnosis not present

## 2015-02-15 DIAGNOSIS — M5030 Other cervical disc degeneration, unspecified cervical region: Secondary | ICD-10-CM | POA: Diagnosis not present

## 2015-02-15 DIAGNOSIS — M542 Cervicalgia: Secondary | ICD-10-CM | POA: Diagnosis not present

## 2015-02-15 DIAGNOSIS — M545 Low back pain: Secondary | ICD-10-CM | POA: Diagnosis not present

## 2015-02-15 DIAGNOSIS — M5136 Other intervertebral disc degeneration, lumbar region: Secondary | ICD-10-CM | POA: Diagnosis not present

## 2015-04-11 DIAGNOSIS — Z79899 Other long term (current) drug therapy: Secondary | ICD-10-CM | POA: Diagnosis not present

## 2015-04-11 DIAGNOSIS — E291 Testicular hypofunction: Secondary | ICD-10-CM | POA: Diagnosis not present

## 2015-04-11 DIAGNOSIS — N411 Chronic prostatitis: Secondary | ICD-10-CM | POA: Diagnosis not present

## 2015-04-11 DIAGNOSIS — N138 Other obstructive and reflux uropathy: Secondary | ICD-10-CM | POA: Diagnosis not present

## 2015-04-11 DIAGNOSIS — N401 Enlarged prostate with lower urinary tract symptoms: Secondary | ICD-10-CM | POA: Diagnosis not present

## 2015-04-26 DIAGNOSIS — M542 Cervicalgia: Secondary | ICD-10-CM | POA: Diagnosis not present

## 2015-04-26 DIAGNOSIS — M5030 Other cervical disc degeneration, unspecified cervical region: Secondary | ICD-10-CM | POA: Diagnosis not present

## 2015-04-26 DIAGNOSIS — M5136 Other intervertebral disc degeneration, lumbar region: Secondary | ICD-10-CM | POA: Diagnosis not present

## 2015-04-26 DIAGNOSIS — M545 Low back pain: Secondary | ICD-10-CM | POA: Diagnosis not present

## 2015-04-29 ENCOUNTER — Encounter: Payer: Self-pay | Admitting: Internal Medicine

## 2015-05-17 ENCOUNTER — Ambulatory Visit (INDEPENDENT_AMBULATORY_CARE_PROVIDER_SITE_OTHER): Payer: Medicare Other | Admitting: Internal Medicine

## 2015-05-17 ENCOUNTER — Encounter: Payer: Self-pay | Admitting: Internal Medicine

## 2015-05-17 VITALS — BP 105/57 | HR 69 | Temp 98.4°F | Ht 70.5 in | Wt 178.4 lb

## 2015-05-17 DIAGNOSIS — Z23 Encounter for immunization: Secondary | ICD-10-CM | POA: Diagnosis not present

## 2015-05-17 DIAGNOSIS — Z0000 Encounter for general adult medical examination without abnormal findings: Secondary | ICD-10-CM | POA: Insufficient documentation

## 2015-05-17 DIAGNOSIS — Z79899 Other long term (current) drug therapy: Secondary | ICD-10-CM | POA: Diagnosis not present

## 2015-05-17 DIAGNOSIS — Z1211 Encounter for screening for malignant neoplasm of colon: Secondary | ICD-10-CM | POA: Insufficient documentation

## 2015-05-17 LAB — CBC WITH DIFFERENTIAL/PLATELET
BASOS ABS: 0.1 10*3/uL (ref 0.0–0.1)
BASOS PCT: 1 % (ref 0–1)
EOS ABS: 0.2 10*3/uL (ref 0.0–0.7)
Eosinophils Relative: 3 % (ref 0–5)
HCT: 40.1 % (ref 39.0–52.0)
Hemoglobin: 14.5 g/dL (ref 13.0–17.0)
LYMPHS ABS: 1.9 10*3/uL (ref 0.7–4.0)
Lymphocytes Relative: 31 % (ref 12–46)
MCH: 33.2 pg (ref 26.0–34.0)
MCHC: 36.2 g/dL — ABNORMAL HIGH (ref 30.0–36.0)
MCV: 91.8 fL (ref 78.0–100.0)
MPV: 8.8 fL (ref 8.6–12.4)
Monocytes Absolute: 0.4 10*3/uL (ref 0.1–1.0)
Monocytes Relative: 6 % (ref 3–12)
NEUTROS PCT: 59 % (ref 43–77)
Neutro Abs: 3.6 10*3/uL (ref 1.7–7.7)
PLATELETS: 227 10*3/uL (ref 150–400)
RBC: 4.37 MIL/uL (ref 4.22–5.81)
RDW: 13.7 % (ref 11.5–15.5)
WBC: 6.1 10*3/uL (ref 4.0–10.5)

## 2015-05-17 MED ORDER — TRIAMCINOLONE ACETONIDE 0.1 % EX CREA: 1.0000 "application " | TOPICAL_CREAM | Freq: Two times a day (BID) | CUTANEOUS | Status: AC

## 2015-05-17 NOTE — Progress Notes (Signed)
Subjective:    Patient ID: Jared Farley, male    DOB: 1960/04/18, 55 y.o.   MRN: [id number]  HPI  55YO male presents for physical exam.  Continues to be followed by pain management. On MorphineXR 60mg  tid. Was using Oxycodone for breakthrough pain, however this was stopped. Pain is much worse at this point. He had significant improvement with local steroid injection in the past, but relief only lasted for 3 days.  Aside from this, doing well.  Wt Readings from Last 3 Encounters:  05/17/15 178 lb 6 oz (80.91 kg)  06/19/14 166 lb 6 oz (75.467 kg)  03/29/14 169 lb 8 oz (76.885 kg)   BP Readings from Last 3 Encounters:  05/17/15 105/57  06/19/14 94/60  03/29/14 94/59    Past Medical History  Diagnosis Date  . Degenerative joint disease   . Chronic pain following surgery or procedure     testicular   No family history on file. No past surgical history on file. Social History   Social History  . Marital Status: Divorced    Spouse Name: N/A  . Number of Children: N/A  . Years of Education: N/A   Social History Main Topics  . Smoking status: Current Every Day Smoker -- 1.00 packs/day  . Smokeless tobacco: Never Used  . Alcohol Use: Yes     Comment: rarely  . Drug Use: No  . Sexual Activity: Not Asked   Other Topics Concern  . None   Social History Narrative    Review of Systems  Constitutional: Negative for fever, chills, activity change, appetite change, fatigue and unexpected weight change.  Eyes: Negative for visual disturbance.  Respiratory: Negative for cough and shortness of breath.   Cardiovascular: Negative for chest pain, palpitations and leg swelling.  Gastrointestinal: Negative for nausea, vomiting, abdominal pain, diarrhea, constipation and abdominal distention.  Genitourinary: Positive for testicular pain. Negative for dysuria, urgency, frequency, penile swelling, difficulty urinating, genital sores and penile pain.  Musculoskeletal: Negative for  arthralgias and gait problem.  Skin: Negative for color change and rash.  Hematological: Negative for adenopathy.  Psychiatric/Behavioral: Positive for dysphoric mood. Negative for suicidal ideas and sleep disturbance. The patient is not nervous/anxious.        Objective:    BP 105/57 mmHg  Pulse 69  Temp(Src) 98.4 F (36.9 C) (Oral)  Ht 5' 10.5" (1.791 m)  Wt 178 lb 6 oz (80.91 kg)  BMI 25.22 kg/m2  SpO2 95% Physical Exam  Constitutional: He is oriented to person, place, and time. He appears well-developed and well-nourished. No distress.  HENT:  Head: Normocephalic and atraumatic.  Right Ear: External ear normal.  Left Ear: External ear normal.  Nose: Nose normal.  Mouth/Throat: Oropharynx is clear and moist. No oropharyngeal exudate.  Eyes: Conjunctivae and EOM are normal. Pupils are equal, round, and reactive to light. Right eye exhibits no discharge. Left eye exhibits no discharge. No scleral icterus.  Neck: Normal range of motion. Neck supple. No tracheal deviation present. No thyromegaly present.  Cardiovascular: Normal rate, regular rhythm and normal heart sounds.  Exam reveals no gallop and no friction rub.   No murmur heard. Pulmonary/Chest: Effort normal. No accessory muscle usage. No respiratory distress. He has decreased breath sounds (prolonged expiration). He has no wheezes. He has no rhonchi. He has no rales. He exhibits no tenderness.  Abdominal: Soft. Bowel sounds are normal. He exhibits no distension and no mass. There is no tenderness. There is no rebound and  no guarding.  Musculoskeletal: Normal range of motion. He exhibits no edema.  Lymphadenopathy:    He has no cervical adenopathy.  Neurological: He is alert and oriented to person, place, and time. No cranial nerve deficit. Coordination normal.  Skin: Skin is warm and dry. No rash noted. He is not diaphoretic. No erythema. No pallor.  Psychiatric: He has a normal mood and affect. His behavior is normal.  Judgment and thought content normal.          Assessment & Plan:   Problem List Items Addressed This Visit      Unprioritized   Routine general medical examination at a health care facility - Primary    General medical exam normal today except as noted. Labs as ordered. Referral placed for colonoscopy. Immunizations UTD except flu vaccine which will be given today.      Relevant Orders   Hepatitis C antibody   CBC with Differential/Platelet   Comprehensive metabolic panel   Lipid panel   Special screening for malignant neoplasms, colon   Relevant Orders   Ambulatory referral to Gastroenterology       Return in about 6 months (around 11/17/2015) for Recheck.  Ronette Deter, MD [facility]

## 2015-05-17 NOTE — Progress Notes (Signed)
Pre visit review using our clinic review tool, if applicable. No additional management support is needed unless otherwise documented below in the visit note. 

## 2015-05-17 NOTE — Patient Instructions (Signed)

## 2015-05-17 NOTE — Assessment & Plan Note (Signed)
General medical exam normal today except as noted. Labs as ordered. Referral placed for colonoscopy. Immunizations UTD except flu vaccine which will be given today.

## 2015-05-18 LAB — LIPID PANEL
CHOLESTEROL: 191 mg/dL (ref 125–200)
HDL: 53 mg/dL (ref >=40)
LDL Cholesterol: 124 mg/dL (ref <130)
Total CHOL/HDL Ratio: 3.6 Ratio (ref <=5.0)
Triglycerides: 71 mg/dL (ref <150)
VLDL: 14 mg/dL (ref <30)

## 2015-05-18 LAB — COMPREHENSIVE METABOLIC PANEL
ALK PHOS: 59 U/L (ref 40–115)
ALT: 9 U/L (ref 9–46)
AST: 15 U/L (ref 10–35)
Albumin: 4.2 g/dL (ref 3.6–5.1)
BUN: 12 mg/dL (ref 7–25)
CO2: 26 mmol/L (ref 20–31)
CREATININE: 0.78 mg/dL (ref 0.70–1.33)
Calcium: 9.2 mg/dL (ref 8.6–10.3)
Chloride: 101 mmol/L (ref 98–110)
Glucose, Bld: 95 mg/dL (ref 65–99)
Potassium: 4.8 mmol/L (ref 3.5–5.3)
SODIUM: 139 mmol/L (ref 135–146)
TOTAL PROTEIN: 7.1 g/dL (ref 6.1–8.1)
Total Bilirubin: 0.5 mg/dL (ref 0.2–1.2)

## 2015-05-18 LAB — HEPATITIS C ANTIBODY: HCV Ab: NEGATIVE

## 2015-05-20 ENCOUNTER — Encounter: Payer: Self-pay | Admitting: *Deleted

## 2015-06-12 DIAGNOSIS — N411 Chronic prostatitis: Secondary | ICD-10-CM | POA: Diagnosis not present

## 2015-06-12 DIAGNOSIS — N50819 Testicular pain, unspecified: Secondary | ICD-10-CM | POA: Diagnosis not present

## 2015-06-12 DIAGNOSIS — E291 Testicular hypofunction: Secondary | ICD-10-CM | POA: Diagnosis not present

## 2015-06-12 DIAGNOSIS — N138 Other obstructive and reflux uropathy: Secondary | ICD-10-CM | POA: Diagnosis not present

## 2015-06-12 DIAGNOSIS — N401 Enlarged prostate with lower urinary tract symptoms: Secondary | ICD-10-CM | POA: Diagnosis not present

## 2015-06-21 DIAGNOSIS — M5030 Other cervical disc degeneration, unspecified cervical region: Secondary | ICD-10-CM | POA: Diagnosis not present

## 2015-06-21 DIAGNOSIS — M542 Cervicalgia: Secondary | ICD-10-CM | POA: Diagnosis not present

## 2015-06-21 DIAGNOSIS — M545 Low back pain: Secondary | ICD-10-CM | POA: Diagnosis not present

## 2015-06-21 DIAGNOSIS — M5136 Other intervertebral disc degeneration, lumbar region: Secondary | ICD-10-CM | POA: Diagnosis not present

## 2015-07-05 ENCOUNTER — Ambulatory Visit: Payer: Self-pay | Admitting: Family Medicine

## 2015-07-05 ENCOUNTER — Emergency Department
Admission: EM | Admit: 2015-07-05 | Discharge: 2015-07-05 | Disposition: A | Discharge status: Home / Self Care | Payer: Medicare Other | Attending: Emergency Medicine | Admitting: Emergency Medicine

## 2015-07-05 ENCOUNTER — Encounter: Payer: Self-pay | Admitting: Emergency Medicine

## 2015-07-05 ENCOUNTER — Emergency Department: Payer: Medicare Other

## 2015-07-05 DIAGNOSIS — M7989 Other specified soft tissue disorders: Secondary | ICD-10-CM | POA: Diagnosis not present

## 2015-07-05 DIAGNOSIS — J449 Chronic obstructive pulmonary disease, unspecified: Secondary | ICD-10-CM | POA: Diagnosis not present

## 2015-07-05 DIAGNOSIS — Z85038 Personal history of other malignant neoplasm of large intestine: Secondary | ICD-10-CM | POA: Diagnosis not present

## 2015-07-05 DIAGNOSIS — M25531 Pain in right wrist: Secondary | ICD-10-CM | POA: Diagnosis not present

## 2015-07-05 DIAGNOSIS — G5601 Carpal tunnel syndrome, right upper limb: Secondary | ICD-10-CM

## 2015-07-05 DIAGNOSIS — Z7982 Long term (current) use of aspirin: Secondary | ICD-10-CM | POA: Insufficient documentation

## 2015-07-05 DIAGNOSIS — F17200 Nicotine dependence, unspecified, uncomplicated: Secondary | ICD-10-CM | POA: Diagnosis not present

## 2015-07-05 DIAGNOSIS — Z0289 Encounter for other administrative examinations: Secondary | ICD-10-CM

## 2015-07-05 MED ORDER — MELOXICAM 15 MG PO TABS: 15.0000 mg | ORAL_TABLET | Freq: Every day | ORAL | Status: DC

## 2015-07-05 NOTE — ED Provider Notes (Signed)
[facility] Emergency Department Provider Note  ____________________________________________  Time seen: Approximately 7:22 PM  I have reviewed the triage vital signs and the nursing notes.   HISTORY  Chief Complaint Wrist Pain    HPI Jared Farley is a 55 y.o. male complaining of right wrist pain x 3 days. Patient states he noticed the pain Wednesday morning when he woke up and that his brother mentioned he may have heard him fall from his bed in the night. He is wondering if he may have fallen on it then causing the injury. He denies numbness or tingling in the affected hand, injury to the right elbow or shoulder, bruising, swelling or head trauma. Admits to decreased ROM, radiation to the elbow that he describes as a "funny bone sensation" and repetitive movements such as playing guitar and piano. Of note, patient has chronic pain and is currently taking morphine and oxycodone daily. He states these do not help the current pain but that he is does not feel he needs additional pain control.    Past Medical History  Diagnosis Date  . Degenerative joint disease   . Chronic pain following surgery or procedure     testicular    Patient Active Problem List   Diagnosis Date Noted  . Routine general medical examination at a health care facility 05/17/2015  . Special screening for malignant neoplasms, colon 05/17/2015  . Medicare annual wellness visit, subsequent 02/26/2014  . PTSD (post-traumatic stress disorder) 02/26/2014  . Screening for colon cancer 02/26/2014  . BPH (benign prostatic hyperplasia) 11/27/2013  . Insomnia 11/27/2013  . COPD (chronic obstructive pulmonary disease) ([place]) 01/04/2013  . Tobacco abuse counseling 01/04/2013  . Temperature intolerance 05/04/2012  . Hypogonadism male 12/18/2010  . Chronic pain 12/18/2010    History reviewed. No pertinent past surgical history.  Current Outpatient Rx  Name  Route  Sig  Dispense  Refill  .  alfuzosin (UROXATRAL) 10 MG 24 hr tablet   Oral   Take 1 tablet (10 mg total) by mouth daily.   30 tablet   6   . aspirin 81 MG tablet   Oral   Take 81 mg by mouth daily.         . Cholecalciferol (VITAMIN D-3 PO)   Oral   Take by mouth.         . meloxicam (MOBIC) 15 MG tablet   Oral   Take 1 tablet (15 mg total) by mouth daily.   30 tablet   0   . morphine (MS CONTIN) 60 MG 12 hr tablet   Oral   Take 60 mg by mouth 3 (three) times daily.         . sertraline (ZOLOFT) 100 MG tablet   Oral   Take 1 tablet (100 mg total) by mouth at bedtime.   30 tablet   3   . Testosterone 40.5 MG/2.5GM (1.62%) GEL   Transdermal   Place onto the skin.         Marland Kitchen triamcinolone cream (KENALOG) 0.1 %   Topical   Apply 1 application topically 2 (two) times daily.   30 g   0     Allergies Trazodone and nefazodone  No family history on file.  Social History Social History  Substance Use Topics  . Smoking status: Current Every Day Smoker -- 1.00 packs/day  . Smokeless tobacco: Never Used  . Alcohol Use: Yes     Comment: rarely  Review of Systems  Eyes: No visual changes. ENT: No upper respiratory complaints. Cardiovascular: no chest pain. Respiratory: no cough. No SOB. Musculoskeletal: Positive for musculoskeletal pain in the right wrist that radiates proximally to the right elbow and distally to the fingers Skin: Negative for rash, abrasions, lacerations, ecchymosis. Neurological: Negative for headaches, focal weakness or numbness. 10-point ROS otherwise negative.  ____________________________________________   PHYSICAL EXAM:  VITAL SIGNS: ED Triage Vitals  Enc Vitals Group     BP 07/05/15 1802 111/68 mmHg     Pulse Rate 07/05/15 1802 72     Resp 07/05/15 1802 16     Temp 07/05/15 1802 98 F (36.7 C)     Temp Source 07/05/15 1802 Oral     SpO2 07/05/15 1802 95 %     Weight 07/05/15 1802 165 lb (74.844 kg)     Height 07/05/15 1802 5\' 10"  (1.778 m)      Head Cir --      Peak Flow --      Pain Score 07/05/15 1803 3     Pain Loc --      Pain Edu? --      Excl. in [place]? --      Constitutional: Alert and oriented. Well appearing and in no acute distress. Head: Atraumatic. Neck: No stridor. Cardiovascular: Good peripheral circulation. Respiratory: Normal respiratory effort without tachypnea or retractions.  Musculoskeletal: Full range of motion to all extremities, limited in the right wrist due to pain. No gross deformities appreciated. Palpable cystic changes in the right ulna. Positive Tinnel's, positive Falen's.  Neurologic:  Normal speech and language. No gross focal neurologic deficits are appreciated.  Skin:  Skin is warm, dry and intact. No rash noted. Psychiatric: Mood and affect are normal. Speech and behavior are normal. Patient exhibits appropriate insight and judgement.   ____________________________________________   LABS (all labs ordered are listed, but only abnormal results are displayed)  Labs Reviewed - No data to display ____________________________________________  EKG   ____________________________________________  RADIOLOGY Diamantina Providence Ridgely Anastacio, personally viewed and evaluated these images (plain radiographs) as part of my medical decision making, as well as reviewing the written report by the radiologist.  Dg Wrist Complete Right  07/05/2015  CLINICAL DATA:  55 year old male with right wrist pain following injury 3 days ago. Initial encounter. EXAM: RIGHT WRIST - COMPLETE 3+ VIEW COMPARISON:  None. FINDINGS: There is no evidence of acute fracture, subluxation or dislocation. Cystic changes in the distal ulna noted. No suspicious focal bony lesions noted. There is soft tissue swelling present. IMPRESSION: Soft tissue swelling without acute bony abnormality. Electronically Signed   By: Margarette Canada M.D.   On: 07/05/2015 18:50    ____________________________________________    PROCEDURES  Procedure(s)  performed:       Medications - No data to display   ____________________________________________   INITIAL IMPRESSION / ASSESSMENT AND PLAN / ED COURSE  Pertinent labs & imaging results that were available during my care of the patient were reviewed by me and considered in my medical decision making (see chart for details).  Patient's diagnosis is consistent with carpel tunnels syndrome. Patient will be discharged home with prescriptions for Meloxicam. Patient is to follow up with orthopedics as needed or otherwise directed. Patient is given ED precautions to return to the ED for any worsening or new symptoms.     ____________________________________________  FINAL CLINICAL IMPRESSION(S) / ED DIAGNOSES  Final diagnoses:  Carpal tunnel syndrome of right wrist  NEW MEDICATIONS STARTED DURING THIS VISIT:  New Prescriptions   MELOXICAM (MOBIC) 15 MG TABLET    Take 1 tablet (15 mg total) by mouth daily.        This chart was dictated using voice recognition software/Dragon. Despite best efforts to proofread, errors can occur which can change the meaning. Any change was purely unintentional.   Darletta Moll, PA-C 07/05/15 2037  Earleen Newport, MD 07/05/15 2149

## 2015-07-05 NOTE — ED Notes (Signed)
Splint applied per md order.

## 2015-07-05 NOTE — Discharge Instructions (Signed)

## 2015-07-05 NOTE — ED Notes (Signed)
States he fell out of bed Wednesday and has been c/o right wrist pain.

## 2015-08-12 DIAGNOSIS — Z1283 Encounter for screening for malignant neoplasm of skin: Secondary | ICD-10-CM | POA: Diagnosis not present

## 2015-08-22 DIAGNOSIS — M545 Low back pain: Secondary | ICD-10-CM | POA: Diagnosis not present

## 2015-08-22 DIAGNOSIS — M542 Cervicalgia: Secondary | ICD-10-CM | POA: Diagnosis not present

## 2015-08-22 DIAGNOSIS — M5136 Other intervertebral disc degeneration, lumbar region: Secondary | ICD-10-CM | POA: Diagnosis not present

## 2015-08-22 DIAGNOSIS — M5030 Other cervical disc degeneration, unspecified cervical region: Secondary | ICD-10-CM | POA: Diagnosis not present

## 2015-09-06 ENCOUNTER — Encounter: Payer: Self-pay | Admitting: Internal Medicine

## 2015-10-03 ENCOUNTER — Emergency Department
Admission: EM | Admit: 2015-10-03 | Discharge: 2015-10-03 | Disposition: A | Discharge status: Home / Self Care | Payer: Medicare Other | Attending: Emergency Medicine | Admitting: Emergency Medicine

## 2015-10-03 ENCOUNTER — Encounter: Payer: Self-pay | Admitting: Emergency Medicine

## 2015-10-03 ENCOUNTER — Emergency Department: Payer: Medicare Other

## 2015-10-03 DIAGNOSIS — R200 Anesthesia of skin: Secondary | ICD-10-CM | POA: Insufficient documentation

## 2015-10-03 DIAGNOSIS — Z7982 Long term (current) use of aspirin: Secondary | ICD-10-CM | POA: Insufficient documentation

## 2015-10-03 DIAGNOSIS — Y9389 Activity, other specified: Secondary | ICD-10-CM | POA: Diagnosis not present

## 2015-10-03 DIAGNOSIS — F17200 Nicotine dependence, unspecified, uncomplicated: Secondary | ICD-10-CM | POA: Diagnosis not present

## 2015-10-03 DIAGNOSIS — S0990XA Unspecified injury of head, initial encounter: Secondary | ICD-10-CM | POA: Diagnosis not present

## 2015-10-03 DIAGNOSIS — Y92410 Unspecified street and highway as the place of occurrence of the external cause: Secondary | ICD-10-CM | POA: Diagnosis not present

## 2015-10-03 DIAGNOSIS — M542 Cervicalgia: Secondary | ICD-10-CM | POA: Diagnosis not present

## 2015-10-03 DIAGNOSIS — Z041 Encounter for examination and observation following transport accident: Secondary | ICD-10-CM | POA: Diagnosis present

## 2015-10-03 DIAGNOSIS — M545 Low back pain, unspecified: Secondary | ICD-10-CM

## 2015-10-03 DIAGNOSIS — J449 Chronic obstructive pulmonary disease, unspecified: Secondary | ICD-10-CM | POA: Diagnosis not present

## 2015-10-03 DIAGNOSIS — Y999 Unspecified external cause status: Secondary | ICD-10-CM | POA: Insufficient documentation

## 2015-10-03 DIAGNOSIS — S3992XA Unspecified injury of lower back, initial encounter: Secondary | ICD-10-CM | POA: Diagnosis not present

## 2015-10-03 DIAGNOSIS — S199XXA Unspecified injury of neck, initial encounter: Secondary | ICD-10-CM | POA: Diagnosis not present

## 2015-10-03 MED ORDER — METHOCARBAMOL 500 MG PO TABS: 500.0000 mg | ORAL_TABLET | Freq: Four times a day (QID) | ORAL | 0 refills | Status: DC

## 2015-10-03 MED ORDER — MELOXICAM 15 MG PO TABS: 15.0000 mg | ORAL_TABLET | Freq: Every day | ORAL | 0 refills | Status: DC
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
## 2015-10-10 ENCOUNTER — Encounter (INDEPENDENT_AMBULATORY_CARE_PROVIDER_SITE_OTHER): Payer: Self-pay

## 2015-10-10 ENCOUNTER — Ambulatory Visit (INDEPENDENT_AMBULATORY_CARE_PROVIDER_SITE_OTHER): Payer: Self-pay | Admitting: Family Medicine

## 2015-10-10 ENCOUNTER — Encounter: Payer: Self-pay | Admitting: Family Medicine

## 2015-10-10 VITALS — BP 110/69 | HR 62 | Temp 97.7°F | Wt 172.4 lb

## 2015-10-10 DIAGNOSIS — M545 Low back pain, unspecified: Secondary | ICD-10-CM | POA: Insufficient documentation

## 2015-10-10 MED ORDER — METHYLPREDNISOLONE ACETATE 80 MG/ML IJ SUSP
80.0000 mg | Freq: Once | INTRAMUSCULAR | Status: AC
  Administered 2015-10-10: 80 mg via INTRAMUSCULAR
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
## 2015-10-10 NOTE — Assessment & Plan Note (Signed)
Acute on chronic. Patient to continue his home pain medication. IM Depo-Medrol given today. Patient to discontinue meloxicam. Continue Robaxin as needed.

## 2015-10-10 NOTE — Patient Instructions (Signed)
Stop the meloxicam.  Continue your other medications.  Follow up as needed  Take care  Dr. Lacinda Axon

## 2015-10-17 DIAGNOSIS — M5030 Other cervical disc degeneration, unspecified cervical region: Secondary | ICD-10-CM | POA: Diagnosis not present

## 2015-10-17 DIAGNOSIS — M5136 Other intervertebral disc degeneration, lumbar region: Secondary | ICD-10-CM | POA: Diagnosis not present

## 2015-10-17 DIAGNOSIS — M542 Cervicalgia: Secondary | ICD-10-CM | POA: Diagnosis not present

## 2015-10-17 DIAGNOSIS — M545 Low back pain: Secondary | ICD-10-CM | POA: Diagnosis not present

## 2015-11-12 DIAGNOSIS — Z79899 Other long term (current) drug therapy: Secondary | ICD-10-CM | POA: Diagnosis not present

## 2015-11-12 DIAGNOSIS — E291 Testicular hypofunction: Secondary | ICD-10-CM | POA: Diagnosis not present

## 2015-11-12 DIAGNOSIS — N401 Enlarged prostate with lower urinary tract symptoms: Secondary | ICD-10-CM | POA: Diagnosis not present

## 2015-11-12 DIAGNOSIS — N411 Chronic prostatitis: Secondary | ICD-10-CM | POA: Diagnosis not present

## 2015-11-12 DIAGNOSIS — N138 Other obstructive and reflux uropathy: Secondary | ICD-10-CM | POA: Diagnosis not present

## 2015-11-18 ENCOUNTER — Ambulatory Visit: Payer: Self-pay | Admitting: Internal Medicine

## 2015-12-16 DIAGNOSIS — M542 Cervicalgia: Secondary | ICD-10-CM | POA: Diagnosis not present

## 2015-12-16 DIAGNOSIS — M545 Low back pain: Secondary | ICD-10-CM | POA: Diagnosis not present

## 2015-12-16 DIAGNOSIS — M5030 Other cervical disc degeneration, unspecified cervical region: Secondary | ICD-10-CM | POA: Diagnosis not present

## 2015-12-16 DIAGNOSIS — M5136 Other intervertebral disc degeneration, lumbar region: Secondary | ICD-10-CM | POA: Diagnosis not present

## 2016-02-12 DIAGNOSIS — M5136 Other intervertebral disc degeneration, lumbar region: Secondary | ICD-10-CM | POA: Diagnosis not present

## 2016-02-12 DIAGNOSIS — M542 Cervicalgia: Secondary | ICD-10-CM | POA: Diagnosis not present

## 2016-02-12 DIAGNOSIS — M545 Low back pain: Secondary | ICD-10-CM | POA: Diagnosis not present

## 2016-02-12 DIAGNOSIS — M5030 Other cervical disc degeneration, unspecified cervical region: Secondary | ICD-10-CM | POA: Diagnosis not present

## 2016-04-14 DIAGNOSIS — M5136 Other intervertebral disc degeneration, lumbar region: Secondary | ICD-10-CM | POA: Diagnosis not present

## 2016-04-14 DIAGNOSIS — M542 Cervicalgia: Secondary | ICD-10-CM | POA: Diagnosis not present

## 2016-04-14 DIAGNOSIS — M545 Low back pain: Secondary | ICD-10-CM | POA: Diagnosis not present

## 2016-04-14 DIAGNOSIS — M5030 Other cervical disc degeneration, unspecified cervical region: Secondary | ICD-10-CM | POA: Diagnosis not present

## 2016-06-11 DIAGNOSIS — M5136 Other intervertebral disc degeneration, lumbar region: Secondary | ICD-10-CM | POA: Diagnosis not present

## 2016-06-11 DIAGNOSIS — M542 Cervicalgia: Secondary | ICD-10-CM | POA: Diagnosis not present

## 2016-06-11 DIAGNOSIS — M545 Low back pain: Secondary | ICD-10-CM | POA: Diagnosis not present

## 2016-06-11 DIAGNOSIS — M5030 Other cervical disc degeneration, unspecified cervical region: Secondary | ICD-10-CM | POA: Diagnosis not present

## 2016-08-12 DIAGNOSIS — M5030 Other cervical disc degeneration, unspecified cervical region: Secondary | ICD-10-CM | POA: Diagnosis not present

## 2016-08-12 DIAGNOSIS — M542 Cervicalgia: Secondary | ICD-10-CM | POA: Diagnosis not present

## 2016-08-12 DIAGNOSIS — M5136 Other intervertebral disc degeneration, lumbar region: Secondary | ICD-10-CM | POA: Diagnosis not present

## 2016-08-12 DIAGNOSIS — M545 Low back pain: Secondary | ICD-10-CM | POA: Diagnosis not present

## 2016-10-09 DIAGNOSIS — M5030 Other cervical disc degeneration, unspecified cervical region: Secondary | ICD-10-CM | POA: Diagnosis not present

## 2016-10-09 DIAGNOSIS — M542 Cervicalgia: Secondary | ICD-10-CM | POA: Diagnosis not present

## 2016-10-09 DIAGNOSIS — M5136 Other intervertebral disc degeneration, lumbar region: Secondary | ICD-10-CM | POA: Diagnosis not present

## 2016-10-09 DIAGNOSIS — M545 Low back pain: Secondary | ICD-10-CM | POA: Diagnosis not present

## 2016-10-23 NOTE — Telephone Encounter (Signed)
Error

## 2016-12-10 DIAGNOSIS — M5136 Other intervertebral disc degeneration, lumbar region: Secondary | ICD-10-CM | POA: Diagnosis not present

## 2016-12-10 DIAGNOSIS — M545 Low back pain: Secondary | ICD-10-CM | POA: Diagnosis not present

## 2016-12-10 DIAGNOSIS — M542 Cervicalgia: Secondary | ICD-10-CM | POA: Diagnosis not present

## 2016-12-10 DIAGNOSIS — M5030 Other cervical disc degeneration, unspecified cervical region: Secondary | ICD-10-CM | POA: Diagnosis not present

## 2017-02-08 DIAGNOSIS — M542 Cervicalgia: Secondary | ICD-10-CM | POA: Diagnosis not present

## 2017-02-08 DIAGNOSIS — M5030 Other cervical disc degeneration, unspecified cervical region: Secondary | ICD-10-CM | POA: Diagnosis not present

## 2017-02-08 DIAGNOSIS — M545 Low back pain: Secondary | ICD-10-CM | POA: Diagnosis not present

## 2017-03-18 DIAGNOSIS — H25813 Combined forms of age-related cataract, bilateral: Secondary | ICD-10-CM | POA: Diagnosis not present
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
## 2017-04-09 DIAGNOSIS — M5030 Other cervical disc degeneration, unspecified cervical region: Secondary | ICD-10-CM | POA: Diagnosis not present

## 2017-04-09 DIAGNOSIS — G8929 Other chronic pain: Secondary | ICD-10-CM | POA: Diagnosis not present

## 2017-04-09 DIAGNOSIS — M545 Low back pain: Secondary | ICD-10-CM | POA: Diagnosis not present

## 2017-04-09 DIAGNOSIS — M542 Cervicalgia: Secondary | ICD-10-CM | POA: Diagnosis not present

## 2017-06-04 DIAGNOSIS — M545 Low back pain: Secondary | ICD-10-CM | POA: Diagnosis not present

## 2017-06-04 DIAGNOSIS — M5030 Other cervical disc degeneration, unspecified cervical region: Secondary | ICD-10-CM | POA: Diagnosis not present

## 2017-06-04 DIAGNOSIS — M542 Cervicalgia: Secondary | ICD-10-CM | POA: Diagnosis not present

## 2017-06-21 DIAGNOSIS — S61216A Laceration without foreign body of right little finger without damage to nail, initial encounter: Secondary | ICD-10-CM | POA: Diagnosis not present

## 2017-06-22 DIAGNOSIS — L814 Other melanin hyperpigmentation: Secondary | ICD-10-CM | POA: Diagnosis not present

## 2017-06-22 DIAGNOSIS — D225 Melanocytic nevi of trunk: Secondary | ICD-10-CM | POA: Diagnosis not present

## 2017-06-22 DIAGNOSIS — L820 Inflamed seborrheic keratosis: Secondary | ICD-10-CM | POA: Diagnosis not present

## 2017-06-22 DIAGNOSIS — L821 Other seborrheic keratosis: Secondary | ICD-10-CM | POA: Diagnosis not present

## 2017-06-22 DIAGNOSIS — D1801 Hemangioma of skin and subcutaneous tissue: Secondary | ICD-10-CM | POA: Diagnosis not present

## 2017-07-29 DIAGNOSIS — M5136 Other intervertebral disc degeneration, lumbar region: Secondary | ICD-10-CM | POA: Diagnosis not present

## 2017-07-29 DIAGNOSIS — M542 Cervicalgia: Secondary | ICD-10-CM | POA: Diagnosis not present

## 2017-07-29 DIAGNOSIS — M545 Low back pain: Secondary | ICD-10-CM | POA: Diagnosis not present

## 2017-10-01 DIAGNOSIS — G8929 Other chronic pain: Secondary | ICD-10-CM | POA: Diagnosis not present

## 2017-10-01 DIAGNOSIS — M542 Cervicalgia: Secondary | ICD-10-CM | POA: Diagnosis not present

## 2017-10-01 DIAGNOSIS — M545 Low back pain: Secondary | ICD-10-CM | POA: Diagnosis not present

## 2017-10-01 DIAGNOSIS — M5136 Other intervertebral disc degeneration, lumbar region: Secondary | ICD-10-CM | POA: Diagnosis not present

## 2017-11-26 DIAGNOSIS — M5030 Other cervical disc degeneration, unspecified cervical region: Secondary | ICD-10-CM | POA: Diagnosis not present

## 2017-11-26 DIAGNOSIS — M65842 Other synovitis and tenosynovitis, left hand: Secondary | ICD-10-CM | POA: Diagnosis not present

## 2017-11-26 DIAGNOSIS — M545 Low back pain: Secondary | ICD-10-CM | POA: Diagnosis not present

## 2017-12-27 DIAGNOSIS — S60221A Contusion of right hand, initial encounter: Secondary | ICD-10-CM | POA: Diagnosis not present

## 2017-12-27 DIAGNOSIS — S6991XA Unspecified injury of right wrist, hand and finger(s), initial encounter: Secondary | ICD-10-CM | POA: Diagnosis not present

## 2017-12-27 DIAGNOSIS — S6391XA Sprain of unspecified part of right wrist and hand, initial encounter: Secondary | ICD-10-CM | POA: Diagnosis not present

## 2018-01-19 DIAGNOSIS — M65842 Other synovitis and tenosynovitis, left hand: Secondary | ICD-10-CM | POA: Diagnosis not present

## 2018-01-19 DIAGNOSIS — M5030 Other cervical disc degeneration, unspecified cervical region: Secondary | ICD-10-CM | POA: Diagnosis not present

## 2018-01-19 DIAGNOSIS — M545 Low back pain: Secondary | ICD-10-CM | POA: Diagnosis not present

## 2018-01-19 DIAGNOSIS — M542 Cervicalgia: Secondary | ICD-10-CM | POA: Diagnosis not present

## 2018-02-09 DIAGNOSIS — E7800 Pure hypercholesterolemia, unspecified: Secondary | ICD-10-CM | POA: Diagnosis not present

## 2018-02-09 DIAGNOSIS — E559 Vitamin D deficiency, unspecified: Secondary | ICD-10-CM | POA: Diagnosis not present

## 2018-02-09 DIAGNOSIS — R5383 Other fatigue: Secondary | ICD-10-CM | POA: Diagnosis not present

## 2018-02-09 DIAGNOSIS — M519 Unspecified thoracic, thoracolumbar and lumbosacral intervertebral disc disorder: Secondary | ICD-10-CM | POA: Diagnosis not present

## 2018-02-09 DIAGNOSIS — J449 Chronic obstructive pulmonary disease, unspecified: Secondary | ICD-10-CM | POA: Diagnosis not present

## 2018-02-09 DIAGNOSIS — R7309 Other abnormal glucose: Secondary | ICD-10-CM | POA: Diagnosis not present

## 2018-02-09 DIAGNOSIS — M4800 Spinal stenosis, site unspecified: Secondary | ICD-10-CM | POA: Diagnosis not present

## 2018-02-09 DIAGNOSIS — Z136 Encounter for screening for cardiovascular disorders: Secondary | ICD-10-CM | POA: Diagnosis not present

## 2018-02-09 DIAGNOSIS — I714 Abdominal aortic aneurysm, without rupture: Secondary | ICD-10-CM | POA: Diagnosis not present

## 2018-02-09 DIAGNOSIS — R7989 Other specified abnormal findings of blood chemistry: Secondary | ICD-10-CM | POA: Diagnosis not present

## 2018-02-24 DIAGNOSIS — M65332 Trigger finger, left middle finger: Secondary | ICD-10-CM | POA: Diagnosis not present

## 2018-02-24 DIAGNOSIS — S63654A Sprain of metacarpophalangeal joint of right ring finger, initial encounter: Secondary | ICD-10-CM | POA: Diagnosis not present

## 2018-03-28 DIAGNOSIS — M545 Low back pain: Secondary | ICD-10-CM | POA: Diagnosis not present

## 2018-03-28 DIAGNOSIS — M542 Cervicalgia: Secondary | ICD-10-CM | POA: Diagnosis not present

## 2018-03-28 DIAGNOSIS — M5030 Other cervical disc degeneration, unspecified cervical region: Secondary | ICD-10-CM | POA: Diagnosis not present

## 2018-03-28 DIAGNOSIS — M65842 Other synovitis and tenosynovitis, left hand: Secondary | ICD-10-CM | POA: Diagnosis not present

## 2018-05-25 DIAGNOSIS — M5030 Other cervical disc degeneration, unspecified cervical region: Secondary | ICD-10-CM | POA: Diagnosis not present

## 2018-05-25 DIAGNOSIS — M545 Low back pain: Secondary | ICD-10-CM | POA: Diagnosis not present

## 2018-05-25 DIAGNOSIS — M542 Cervicalgia: Secondary | ICD-10-CM | POA: Diagnosis not present

## 2018-05-25 DIAGNOSIS — M65842 Other synovitis and tenosynovitis, left hand: Secondary | ICD-10-CM | POA: Diagnosis not present

## 2018-07-25 DIAGNOSIS — M542 Cervicalgia: Secondary | ICD-10-CM | POA: Diagnosis not present

## 2018-07-25 DIAGNOSIS — M5030 Other cervical disc degeneration, unspecified cervical region: Secondary | ICD-10-CM | POA: Diagnosis not present

## 2018-07-25 DIAGNOSIS — M5136 Other intervertebral disc degeneration, lumbar region: Secondary | ICD-10-CM | POA: Diagnosis not present

## 2018-07-25 DIAGNOSIS — Z79891 Long term (current) use of opiate analgesic: Secondary | ICD-10-CM | POA: Diagnosis not present

## 2018-08-24 DIAGNOSIS — M5136 Other intervertebral disc degeneration, lumbar region: Secondary | ICD-10-CM | POA: Diagnosis not present

## 2018-08-24 DIAGNOSIS — M5030 Other cervical disc degeneration, unspecified cervical region: Secondary | ICD-10-CM | POA: Diagnosis not present

## 2018-08-24 DIAGNOSIS — Z79891 Long term (current) use of opiate analgesic: Secondary | ICD-10-CM | POA: Diagnosis not present

## 2018-08-24 DIAGNOSIS — M542 Cervicalgia: Secondary | ICD-10-CM | POA: Diagnosis not present

## 2018-09-21 DIAGNOSIS — M5136 Other intervertebral disc degeneration, lumbar region: Secondary | ICD-10-CM | POA: Diagnosis not present

## 2018-09-21 DIAGNOSIS — Z79891 Long term (current) use of opiate analgesic: Secondary | ICD-10-CM | POA: Diagnosis not present

## 2018-09-21 DIAGNOSIS — M5030 Other cervical disc degeneration, unspecified cervical region: Secondary | ICD-10-CM | POA: Diagnosis not present

## 2018-09-21 DIAGNOSIS — M542 Cervicalgia: Secondary | ICD-10-CM | POA: Diagnosis not present

## 2018-10-20 DIAGNOSIS — M5030 Other cervical disc degeneration, unspecified cervical region: Secondary | ICD-10-CM | POA: Diagnosis not present

## 2018-10-20 DIAGNOSIS — M5136 Other intervertebral disc degeneration, lumbar region: Secondary | ICD-10-CM | POA: Diagnosis not present

## 2018-10-20 DIAGNOSIS — Z79891 Long term (current) use of opiate analgesic: Secondary | ICD-10-CM | POA: Diagnosis not present

## 2018-10-20 DIAGNOSIS — M542 Cervicalgia: Secondary | ICD-10-CM | POA: Diagnosis not present

## 2018-10-31 DIAGNOSIS — E785 Hyperlipidemia, unspecified: Secondary | ICD-10-CM | POA: Diagnosis not present

## 2018-10-31 DIAGNOSIS — E559 Vitamin D deficiency, unspecified: Secondary | ICD-10-CM | POA: Diagnosis not present

## 2018-10-31 DIAGNOSIS — M519 Unspecified thoracic, thoracolumbar and lumbosacral intervertebral disc disorder: Secondary | ICD-10-CM | POA: Diagnosis not present

## 2018-10-31 DIAGNOSIS — R7309 Other abnormal glucose: Secondary | ICD-10-CM | POA: Diagnosis not present

## 2018-10-31 DIAGNOSIS — J449 Chronic obstructive pulmonary disease, unspecified: Secondary | ICD-10-CM | POA: Diagnosis not present

## 2018-10-31 DIAGNOSIS — M4800 Spinal stenosis, site unspecified: Secondary | ICD-10-CM | POA: Diagnosis not present

## 2018-10-31 DIAGNOSIS — R7303 Prediabetes: Secondary | ICD-10-CM | POA: Diagnosis not present

## 2018-10-31 DIAGNOSIS — E7800 Pure hypercholesterolemia, unspecified: Secondary | ICD-10-CM | POA: Diagnosis not present

## 2018-11-01 DIAGNOSIS — G603 Idiopathic progressive neuropathy: Secondary | ICD-10-CM | POA: Diagnosis not present

## 2018-11-01 DIAGNOSIS — M79661 Pain in right lower leg: Secondary | ICD-10-CM | POA: Diagnosis not present

## 2018-11-15 DIAGNOSIS — I6529 Occlusion and stenosis of unspecified carotid artery: Secondary | ICD-10-CM | POA: Diagnosis not present

## 2018-11-15 DIAGNOSIS — R011 Cardiac murmur, unspecified: Secondary | ICD-10-CM | POA: Diagnosis not present

## 2018-11-15 DIAGNOSIS — I5030 Unspecified diastolic (congestive) heart failure: Secondary | ICD-10-CM | POA: Diagnosis not present

## 2018-11-15 DIAGNOSIS — I361 Nonrheumatic tricuspid (valve) insufficiency: Secondary | ICD-10-CM | POA: Diagnosis not present

## 2018-11-15 DIAGNOSIS — I340 Nonrheumatic mitral (valve) insufficiency: Secondary | ICD-10-CM | POA: Diagnosis not present

## 2018-11-21 DIAGNOSIS — Z79891 Long term (current) use of opiate analgesic: Secondary | ICD-10-CM | POA: Diagnosis not present

## 2018-11-21 DIAGNOSIS — M5030 Other cervical disc degeneration, unspecified cervical region: Secondary | ICD-10-CM | POA: Diagnosis not present

## 2018-11-21 DIAGNOSIS — M5136 Other intervertebral disc degeneration, lumbar region: Secondary | ICD-10-CM | POA: Diagnosis not present

## 2018-11-21 DIAGNOSIS — M542 Cervicalgia: Secondary | ICD-10-CM | POA: Diagnosis not present

## 2018-12-19 DIAGNOSIS — Z79891 Long term (current) use of opiate analgesic: Secondary | ICD-10-CM | POA: Diagnosis not present

## 2018-12-19 DIAGNOSIS — M5030 Other cervical disc degeneration, unspecified cervical region: Secondary | ICD-10-CM | POA: Diagnosis not present

## 2018-12-19 DIAGNOSIS — N411 Chronic prostatitis: Secondary | ICD-10-CM | POA: Diagnosis not present

## 2019-01-11 DIAGNOSIS — Z0001 Encounter for general adult medical examination with abnormal findings: Secondary | ICD-10-CM | POA: Diagnosis not present

## 2019-01-11 DIAGNOSIS — H9201 Otalgia, right ear: Secondary | ICD-10-CM | POA: Diagnosis not present

## 2019-01-11 DIAGNOSIS — Z136 Encounter for screening for cardiovascular disorders: Secondary | ICD-10-CM | POA: Diagnosis not present

## 2019-01-11 DIAGNOSIS — J449 Chronic obstructive pulmonary disease, unspecified: Secondary | ICD-10-CM | POA: Diagnosis not present

## 2019-01-11 DIAGNOSIS — Z1389 Encounter for screening for other disorder: Secondary | ICD-10-CM | POA: Diagnosis not present

## 2019-01-11 DIAGNOSIS — H6091 Unspecified otitis externa, right ear: Secondary | ICD-10-CM | POA: Diagnosis not present

## 2019-01-11 DIAGNOSIS — R7303 Prediabetes: Secondary | ICD-10-CM | POA: Diagnosis not present

## 2019-01-16 DIAGNOSIS — Z79891 Long term (current) use of opiate analgesic: Secondary | ICD-10-CM | POA: Diagnosis not present

## 2019-01-16 DIAGNOSIS — M5136 Other intervertebral disc degeneration, lumbar region: Secondary | ICD-10-CM | POA: Diagnosis not present

## 2019-01-16 DIAGNOSIS — M5030 Other cervical disc degeneration, unspecified cervical region: Secondary | ICD-10-CM | POA: Diagnosis not present

## 2019-01-17 ENCOUNTER — Ambulatory Visit: Payer: Self-pay | Admitting: Urology

## 2019-01-17 ENCOUNTER — Encounter: Payer: Self-pay | Admitting: Urology

## 2019-02-14 DIAGNOSIS — M5030 Other cervical disc degeneration, unspecified cervical region: Secondary | ICD-10-CM | POA: Diagnosis not present

## 2019-02-14 DIAGNOSIS — M5136 Other intervertebral disc degeneration, lumbar region: Secondary | ICD-10-CM | POA: Diagnosis not present

## 2019-02-14 DIAGNOSIS — Z79891 Long term (current) use of opiate analgesic: Secondary | ICD-10-CM | POA: Diagnosis not present

## 2019-03-20 DIAGNOSIS — Z79891 Long term (current) use of opiate analgesic: Secondary | ICD-10-CM | POA: Diagnosis not present

## 2019-03-20 DIAGNOSIS — M5030 Other cervical disc degeneration, unspecified cervical region: Secondary | ICD-10-CM | POA: Diagnosis not present

## 2019-03-20 DIAGNOSIS — M5136 Other intervertebral disc degeneration, lumbar region: Secondary | ICD-10-CM | POA: Diagnosis not present

## 2019-04-14 DIAGNOSIS — M5030 Other cervical disc degeneration, unspecified cervical region: Secondary | ICD-10-CM | POA: Diagnosis not present

## 2019-04-14 DIAGNOSIS — M5136 Other intervertebral disc degeneration, lumbar region: Secondary | ICD-10-CM | POA: Diagnosis not present

## 2019-04-14 DIAGNOSIS — Z79891 Long term (current) use of opiate analgesic: Secondary | ICD-10-CM | POA: Diagnosis not present

## 2019-05-16 DIAGNOSIS — Z79891 Long term (current) use of opiate analgesic: Secondary | ICD-10-CM | POA: Diagnosis not present

## 2019-05-16 DIAGNOSIS — M5030 Other cervical disc degeneration, unspecified cervical region: Secondary | ICD-10-CM | POA: Diagnosis not present

## 2019-05-16 DIAGNOSIS — M5136 Other intervertebral disc degeneration, lumbar region: Secondary | ICD-10-CM | POA: Diagnosis not present

## 2019-06-19 DIAGNOSIS — Z79891 Long term (current) use of opiate analgesic: Secondary | ICD-10-CM | POA: Diagnosis not present

## 2019-06-19 DIAGNOSIS — M5136 Other intervertebral disc degeneration, lumbar region: Secondary | ICD-10-CM | POA: Diagnosis not present

## 2019-06-19 DIAGNOSIS — M5030 Other cervical disc degeneration, unspecified cervical region: Secondary | ICD-10-CM | POA: Diagnosis not present

## 2019-07-19 DIAGNOSIS — M5136 Other intervertebral disc degeneration, lumbar region: Secondary | ICD-10-CM | POA: Diagnosis not present

## 2019-07-19 DIAGNOSIS — Z79891 Long term (current) use of opiate analgesic: Secondary | ICD-10-CM | POA: Diagnosis not present

## 2019-07-19 DIAGNOSIS — M5030 Other cervical disc degeneration, unspecified cervical region: Secondary | ICD-10-CM | POA: Diagnosis not present

## 2019-08-17 DIAGNOSIS — M5136 Other intervertebral disc degeneration, lumbar region: Secondary | ICD-10-CM | POA: Diagnosis not present

## 2019-08-17 DIAGNOSIS — Z79891 Long term (current) use of opiate analgesic: Secondary | ICD-10-CM | POA: Diagnosis not present

## 2019-08-17 DIAGNOSIS — M5030 Other cervical disc degeneration, unspecified cervical region: Secondary | ICD-10-CM | POA: Diagnosis not present

## 2019-09-15 DIAGNOSIS — Z79891 Long term (current) use of opiate analgesic: Secondary | ICD-10-CM | POA: Diagnosis not present

## 2019-09-15 DIAGNOSIS — M5030 Other cervical disc degeneration, unspecified cervical region: Secondary | ICD-10-CM | POA: Diagnosis not present

## 2019-09-15 DIAGNOSIS — M5136 Other intervertebral disc degeneration, lumbar region: Secondary | ICD-10-CM | POA: Diagnosis not present

## 2019-10-17 DIAGNOSIS — Z79891 Long term (current) use of opiate analgesic: Secondary | ICD-10-CM | POA: Diagnosis not present

## 2019-10-17 DIAGNOSIS — M5136 Other intervertebral disc degeneration, lumbar region: Secondary | ICD-10-CM | POA: Diagnosis not present

## 2019-10-17 DIAGNOSIS — M5030 Other cervical disc degeneration, unspecified cervical region: Secondary | ICD-10-CM | POA: Diagnosis not present

## 2019-11-13 DIAGNOSIS — Z1389 Encounter for screening for other disorder: Secondary | ICD-10-CM | POA: Diagnosis not present

## 2019-11-15 DIAGNOSIS — M5136 Other intervertebral disc degeneration, lumbar region: Secondary | ICD-10-CM | POA: Diagnosis not present

## 2019-11-15 DIAGNOSIS — M5030 Other cervical disc degeneration, unspecified cervical region: Secondary | ICD-10-CM | POA: Diagnosis not present

## 2019-11-15 DIAGNOSIS — Z79891 Long term (current) use of opiate analgesic: Secondary | ICD-10-CM | POA: Diagnosis not present

## 2019-11-15 DIAGNOSIS — F17213 Nicotine dependence, cigarettes, with withdrawal: Secondary | ICD-10-CM | POA: Diagnosis not present

## 2019-11-22 DIAGNOSIS — F17213 Nicotine dependence, cigarettes, with withdrawal: Secondary | ICD-10-CM | POA: Diagnosis not present

## 2019-11-28 ENCOUNTER — Encounter: Payer: Self-pay | Admitting: Family Medicine

## 2019-12-03 ENCOUNTER — Other Ambulatory Visit: Payer: Self-pay

## 2019-12-03 ENCOUNTER — Emergency Department
Admission: EM | Admit: 2019-12-03 | Discharge: 2019-12-03 | Disposition: A | Discharge status: Home / Self Care | Payer: Medicare Other | Attending: Emergency Medicine | Admitting: Emergency Medicine

## 2019-12-03 DIAGNOSIS — X58XXXA Exposure to other specified factors, initial encounter: Secondary | ICD-10-CM | POA: Insufficient documentation

## 2019-12-03 DIAGNOSIS — S91301A Unspecified open wound, right foot, initial encounter: Secondary | ICD-10-CM | POA: Diagnosis not present

## 2019-12-03 DIAGNOSIS — Z5321 Procedure and treatment not carried out due to patient leaving prior to being seen by health care provider: Secondary | ICD-10-CM | POA: Insufficient documentation

## 2019-12-03 LAB — CBC WITH DIFFERENTIAL/PLATELET
Abs Immature Granulocytes: 0.05 10*3/uL (ref 0.00–0.07)
Basophils Absolute: 0 10*3/uL (ref 0.0–0.1)
Basophils Relative: 0 %
Eosinophils Absolute: 0.1 10*3/uL (ref 0.0–0.5)
Eosinophils Relative: 1 %
HCT: 36 % — ABNORMAL LOW (ref 39.0–52.0)
Hemoglobin: 12.7 g/dL — ABNORMAL LOW (ref 13.0–17.0)
Immature Granulocytes: 0 %
Lymphocytes Relative: 18 %
Lymphs Abs: 2.2 10*3/uL (ref 0.7–4.0)
MCH: 32.2 pg (ref 26.0–34.0)
MCHC: 35.3 g/dL (ref 30.0–36.0)
MCV: 91.4 fL (ref 80.0–100.0)
Monocytes Absolute: 0.7 10*3/uL (ref 0.1–1.0)
Monocytes Relative: 6 %
Neutro Abs: 9.3 10*3/uL — ABNORMAL HIGH (ref 1.7–7.7)
Neutrophils Relative %: 75 %
Platelets: 205 10*3/uL (ref 150–400)
RBC: 3.94 MIL/uL — ABNORMAL LOW (ref 4.22–5.81)
RDW: 13.3 % (ref 11.5–15.5)
WBC: 12.4 10*3/uL — ABNORMAL HIGH (ref 4.0–10.5)
nRBC: 0 % (ref 0.0–0.2)

## 2019-12-03 LAB — COMPREHENSIVE METABOLIC PANEL
ALT: 12 U/L (ref 0–44)
AST: 20 U/L (ref 15–41)
Albumin: 4.2 g/dL (ref 3.5–5.0)
Alkaline Phosphatase: 62 U/L (ref 38–126)
Anion gap: 10 (ref 5–15)
BUN: 11 mg/dL (ref 6–20)
CO2: 24 mmol/L (ref 22–32)
Calcium: 8.7 mg/dL — ABNORMAL LOW (ref 8.9–10.3)
Chloride: 100 mmol/L (ref 98–111)
Creatinine, Ser: 0.9 mg/dL (ref 0.61–1.24)
GFR calc Af Amer: >60 mL/min (ref >60)
GFR calc non Af Amer: >60 mL/min (ref >60)
Glucose, Bld: 128 mg/dL — ABNORMAL HIGH (ref 70–99)
Potassium: 3.8 mmol/L (ref 3.5–5.1)
Sodium: 134 mmol/L — ABNORMAL LOW (ref 135–145)
Total Bilirubin: 0.9 mg/dL (ref 0.3–1.2)
Total Protein: 8 g/dL (ref 6.5–8.1)

## 2019-12-03 LAB — LACTIC ACID, PLASMA: Lactic Acid, Venous: 1.1 mmol/L (ref 0.5–1.9)

## 2019-12-03 NOTE — ED Notes (Signed)
Patient called for a room with no answer. 

## 2019-12-03 NOTE — ED Notes (Signed)
ONE SET OF CULTURES SENT IF NEEDED.

## 2019-12-03 NOTE — ED Notes (Signed)
Patient called with no answer. °

## 2019-12-03 NOTE — ED Triage Notes (Signed)
Pt comes with wound on right foot since about Friday. Concerned for infection. Swelling, redness, pain around top of foot. Wound originates between 3rd and 4th toe.

## 2019-12-04 DIAGNOSIS — Z23 Encounter for immunization: Secondary | ICD-10-CM | POA: Diagnosis not present

## 2019-12-04 DIAGNOSIS — S91311A Laceration without foreign body, right foot, initial encounter: Secondary | ICD-10-CM | POA: Diagnosis not present

## 2019-12-04 DIAGNOSIS — L089 Local infection of the skin and subcutaneous tissue, unspecified: Secondary | ICD-10-CM | POA: Diagnosis not present

## 2019-12-06 ENCOUNTER — Telehealth: Payer: Self-pay | Admitting: Emergency Medicine

## 2019-12-06 NOTE — Telephone Encounter (Signed)
Called patient due to lwot to inquire about condition and follow up plans. Some one answered but hung up.

## 2019-12-14 DIAGNOSIS — Z79891 Long term (current) use of opiate analgesic: Secondary | ICD-10-CM | POA: Diagnosis not present

## 2019-12-14 DIAGNOSIS — M5030 Other cervical disc degeneration, unspecified cervical region: Secondary | ICD-10-CM | POA: Diagnosis not present

## 2019-12-14 DIAGNOSIS — M5136 Other intervertebral disc degeneration, lumbar region: Secondary | ICD-10-CM | POA: Diagnosis not present

## 2019-12-18 ENCOUNTER — Telehealth (INDEPENDENT_AMBULATORY_CARE_PROVIDER_SITE_OTHER): Payer: Self-pay | Admitting: Gastroenterology

## 2019-12-18 DIAGNOSIS — Z1211 Encounter for screening for malignant neoplasm of colon: Secondary | ICD-10-CM

## 2019-12-18 MED ORDER — NA SULFATE-K SULFATE-MG SULF 17.5-3.13-1.6 GM/177ML PO SOLN: 1.0000 | Freq: Once | ORAL | 0 refills | Status: AC

## 2019-12-18 NOTE — Progress Notes (Signed)
Gastroenterology Pre-Procedure Review  Request Date: 01/02/20 Requesting Physician: Dr. Bonna Gains  PATIENT REVIEW QUESTIONS: The patient responded to the following health history questions as indicated:    1. Are you having any GI issues? no 2. Do you have a personal history of Polyps? no 3. Do you have a family history of Colon Cancer or Polyps? no 4. Diabetes Mellitus? no 5. Joint replacements in the past 12 months?no 6. Major health problems in the past 3 months?no 7. Any artificial heart valves, MVP, or defibrillator?no    MEDICATIONS & ALLERGIES:    Patient reports the following regarding taking any anticoagulation/antiplatelet therapy:   Plavix, Coumadin, Eliquis, Xarelto, Lovenox, Pradaxa, Brilinta, or Effient? no Aspirin? yes (81 mg daily)  Patient confirms/reports the following medications:  Current Outpatient Medications  Medication Sig Dispense Refill  . albuterol (PROVENTIL) (2.5 MG/3ML) 0.083% nebulizer solution 2.5 mg.    . alfuzosin (UROXATRAL) 10 MG 24 hr tablet Take 1 tablet (10 mg total) by mouth daily. 30 tablet 6  . aspirin 81 MG tablet Take 81 mg by mouth daily.    . Cholecalciferol (VITAMIN D-3 PO) Take by mouth.    . morphine (MS CONTIN) 60 MG 12 hr tablet Take 60 mg by mouth 3 (three) times daily.    Marland Kitchen oxyCODONE (ROXICODONE) 15 MG immediate release tablet oxycodone 15 mg tablet    . triamcinolone cream (KENALOG) 0.1 % Apply 1 application topically 2 (two) times daily. 30 g 0  . albuterol (PROAIR HFA) 108 (90 Base) MCG/ACT inhaler ProAir HFA 90 mcg/actuation aerosol inhaler    . influenza vac split quadrivalent PF (FLUZONE QUADRIVALENT) 0.5 ML injection Fluzone Quad 2019-2020 (PF) 60 mcg (15 mcg x 4)/0.5 mL IM syringe    . ipratropium-albuterol (DUONEB) 0.5-2.5 (3) MG/3ML SOLN SMARTSIG:3 Milliliter(s) Via Nebulizer Every 6 Hours    . methocarbamol (ROBAXIN) 500 MG tablet Take 1 tablet (500 mg total) by mouth 4 (four) times daily. (Patient not taking: Reported  on 12/18/2019) 16 tablet 0  . Na Sulfate-K Sulfate-Mg Sulf 17.5-3.13-1.6 GM/177ML SOLN Take 1 kit by mouth once for 1 dose. 354 mL 0  . sertraline (ZOLOFT) 100 MG tablet Take 1 tablet (100 mg total) by mouth at bedtime. (Patient not taking: Reported on 12/18/2019) 30 tablet 3  . SYMBICORT 160-4.5 MCG/ACT inhaler Inhale into the lungs.    . Testosterone 40.5 MG/2.5GM (1.62%) GEL Place onto the skin. (Patient not taking: Reported on 12/18/2019)     No current facility-administered medications for this visit.    Patient confirms/reports the following allergies:  Allergies  Allergen Reactions  . Trazodone And Nefazodone   . Trazodone Hcl     Pt can't recall reaction    Orders Placed This Encounter  Procedures  . Procedural/ Surgical Case Request: COLONOSCOPY WITH PROPOFOL    Standing Status:   Standing    Number of Occurrences:   1    Order Specific Question:   Pre-op diagnosis    Answer:   screening colonoscopy    Order Specific Question:   CPT Code    Answer:   20947    AUTHORIZATION INFORMATION Primary Insurance: 1D#: Group #:  Secondary Insurance: 1D#: Group #:  SCHEDULE INFORMATION: Date:  Time: Location:

## 2019-12-29 ENCOUNTER — Other Ambulatory Visit: Admission: RE | Admit: 2019-12-29 | Payer: Medicare Other | Source: Ambulatory Visit

## 2020-01-02 ENCOUNTER — Ambulatory Visit: Admission: RE | Admit: 2020-01-02 | Payer: Medicare Other | Source: Home / Self Care | Admitting: Gastroenterology

## 2020-01-02 ENCOUNTER — Encounter: Admission: RE | Payer: Self-pay | Source: Home / Self Care

## 2020-01-02 ENCOUNTER — Encounter: Payer: Self-pay | Admitting: Certified Registered Nurse Anesthetist

## 2020-01-02 SURGERY — COLONOSCOPY WITH PROPOFOL
Anesthesia: General

## 2020-01-15 DIAGNOSIS — Z79891 Long term (current) use of opiate analgesic: Secondary | ICD-10-CM | POA: Diagnosis not present

## 2020-01-15 DIAGNOSIS — M5030 Other cervical disc degeneration, unspecified cervical region: Secondary | ICD-10-CM | POA: Diagnosis not present

## 2020-01-15 DIAGNOSIS — M5136 Other intervertebral disc degeneration, lumbar region: Secondary | ICD-10-CM | POA: Diagnosis not present

## 2020-02-16 DIAGNOSIS — M5136 Other intervertebral disc degeneration, lumbar region: Secondary | ICD-10-CM | POA: Diagnosis not present

## 2020-02-16 DIAGNOSIS — Z79891 Long term (current) use of opiate analgesic: Secondary | ICD-10-CM | POA: Diagnosis not present

## 2020-02-16 DIAGNOSIS — M5030 Other cervical disc degeneration, unspecified cervical region: Secondary | ICD-10-CM | POA: Diagnosis not present

## 2020-03-18 DIAGNOSIS — M5136 Other intervertebral disc degeneration, lumbar region: Secondary | ICD-10-CM | POA: Diagnosis not present

## 2020-03-18 DIAGNOSIS — Z79891 Long term (current) use of opiate analgesic: Secondary | ICD-10-CM | POA: Diagnosis not present

## 2020-03-18 DIAGNOSIS — M5030 Other cervical disc degeneration, unspecified cervical region: Secondary | ICD-10-CM | POA: Diagnosis not present

## 2020-04-11 DIAGNOSIS — Z79891 Long term (current) use of opiate analgesic: Secondary | ICD-10-CM | POA: Diagnosis not present

## 2020-04-11 DIAGNOSIS — M5030 Other cervical disc degeneration, unspecified cervical region: Secondary | ICD-10-CM | POA: Diagnosis not present

## 2020-04-11 DIAGNOSIS — M5136 Other intervertebral disc degeneration, lumbar region: Secondary | ICD-10-CM | POA: Diagnosis not present

## 2020-05-13 DIAGNOSIS — Z79891 Long term (current) use of opiate analgesic: Secondary | ICD-10-CM | POA: Diagnosis not present

## 2020-05-13 DIAGNOSIS — M5030 Other cervical disc degeneration, unspecified cervical region: Secondary | ICD-10-CM | POA: Diagnosis not present

## 2020-05-13 DIAGNOSIS — M5136 Other intervertebral disc degeneration, lumbar region: Secondary | ICD-10-CM | POA: Diagnosis not present

## 2020-06-13 DIAGNOSIS — Z79891 Long term (current) use of opiate analgesic: Secondary | ICD-10-CM | POA: Diagnosis not present

## 2020-06-14 DIAGNOSIS — J441 Chronic obstructive pulmonary disease with (acute) exacerbation: Secondary | ICD-10-CM | POA: Diagnosis not present

## 2020-06-14 DIAGNOSIS — Z20822 Contact with and (suspected) exposure to covid-19: Secondary | ICD-10-CM | POA: Diagnosis not present

## 2020-06-14 DIAGNOSIS — J019 Acute sinusitis, unspecified: Secondary | ICD-10-CM | POA: Diagnosis not present

## 2020-06-14 DIAGNOSIS — J209 Acute bronchitis, unspecified: Secondary | ICD-10-CM | POA: Diagnosis not present

## 2020-06-14 DIAGNOSIS — B9689 Other specified bacterial agents as the cause of diseases classified elsewhere: Secondary | ICD-10-CM | POA: Diagnosis not present

## 2020-06-19 DIAGNOSIS — J209 Acute bronchitis, unspecified: Secondary | ICD-10-CM | POA: Diagnosis not present

## 2020-06-19 DIAGNOSIS — R059 Cough, unspecified: Secondary | ICD-10-CM | POA: Diagnosis not present

## 2020-06-26 DIAGNOSIS — J40 Bronchitis, not specified as acute or chronic: Secondary | ICD-10-CM | POA: Diagnosis not present

## 2020-06-26 DIAGNOSIS — R0602 Shortness of breath: Secondary | ICD-10-CM | POA: Diagnosis not present

## 2020-06-26 DIAGNOSIS — R059 Cough, unspecified: Secondary | ICD-10-CM | POA: Diagnosis not present

## 2020-07-04 ENCOUNTER — Encounter: Payer: Self-pay | Admitting: Urology

## 2020-07-04 ENCOUNTER — Ambulatory Visit: Payer: Medicare Other | Admitting: Urology

## 2020-07-04 ENCOUNTER — Other Ambulatory Visit: Payer: Self-pay

## 2020-07-04 VITALS — BP 122/76 | HR 93 | Ht 72.0 in | Wt 185.0 lb

## 2020-07-04 DIAGNOSIS — N529 Male erectile dysfunction, unspecified: Secondary | ICD-10-CM | POA: Diagnosis not present

## 2020-07-04 DIAGNOSIS — E291 Testicular hypofunction: Secondary | ICD-10-CM

## 2020-07-04 DIAGNOSIS — N138 Other obstructive and reflux uropathy: Secondary | ICD-10-CM | POA: Diagnosis not present

## 2020-07-04 DIAGNOSIS — N401 Enlarged prostate with lower urinary tract symptoms: Secondary | ICD-10-CM | POA: Diagnosis not present

## 2020-07-04 DIAGNOSIS — N411 Chronic prostatitis: Secondary | ICD-10-CM | POA: Diagnosis not present

## 2020-07-04 MED ORDER — TADALAFIL 5 MG PO TABS: 5.0000 mg | ORAL_TABLET | Freq: Every day | ORAL | 11 refills | Status: DC

## 2020-07-04 MED ORDER — ALFUZOSIN HCL ER 10 MG PO TB24: 10.0000 mg | ORAL_TABLET | Freq: Every day | ORAL | 11 refills | Status: DC

## 2020-07-04 NOTE — Progress Notes (Signed)
   07/04/20 4:43 PM   [name] Sep 28, 1960 [id number]  CC: History of low testosterone, urinary symptoms, chronic prostatitis, chronic scrotal pain, ED  HPI: I saw Mr. Chicoine in urology clinic for the above issues.  He is a very comorbid 60 year old male with chronic scrotal pain on high-dose narcotics managed by pain management.  He was previously followed by Dr. Jacqlyn Larsen, but has not been seen since the [place] pandemic.  He reports he has chronic prostatitis and urinary symptoms of weak stream, urgency, frequency, and feeling of incomplete emptying that previously were improved on alfuzosin.  He has not been on this medication recently.  He would like to resume this.  He also has a history of low testosterone with symptoms of decreased energy, decreased libido, and trouble with erections, and previously was on AndroGel.  He does not have any recent testosterone or PSA values to review.  Urinalysis today is benign with 0 WBCs, 0-2 RBCs, no bacteria, nitrite negative, no leukocytes.   PMH: Past Medical History:  Diagnosis Date  . Chronic pain following surgery or procedure    testicular  . Degenerative joint disease     Social History:  reports that he has been smoking. He has been smoking about 1.00 pack per day. He has never used smokeless tobacco. He reports current alcohol use. He reports that he does not use drugs.  Physical Exam: BP 122/76   Pulse 93   Ht 6' (1.829 m)   Wt 185 lb (83.9 kg)   BMI 25.09 kg/m    Constitutional:  Alert and oriented, No acute distress. Cardiovascular: No clubbing, cyanosis, or edema. Respiratory: Normal respiratory effort, no increased work of breathing. GI: Abdomen is soft, nontender, nondistended, no abdominal masses GU: Phallus with patent meatus, no lesions, testicles descended and 20 cc bilaterally, no masses, tender bilaterally  Laboratory Data: Reviewed, see HPI  Pertinent Imaging: None to review  Assessment & Plan:   60 year old  male with chronic scrotal pain on high-dose narcotics managed by pain management, history of chronic prostatitis and BPH previously on alfuzosin, and history of low testosterone previously on AndroGel.  No recent labs to review.  Urinalysis today is benign.  I recommended resuming the alfuzosin as this sounded like it worked very well for him previously in the past.  Risks and benefits discussed.  Regarding low testosterone, reviewed the AUA guidelines regarding work-up and management, and I recommended following up for an AM testosterone, LH, PSA, H&H  In terms of his erectile dysfunction, he is interested in trial of PDE 5 inhibitors.  Risks and benefits discussed at length.  -Alfuzosin for BPH -Trial of Cialis 5 mg daily for ED -Follow-up for lab visit for testosterone, LH, PSA, H/H-> call with results and likely PA follow-up if testosterone is low for resuming testosterone replacement therapy  Nickolas Madrid, MD 07/04/2020  [facility] [address], [place], [place] [address] [phone]

## 2020-07-05 ENCOUNTER — Other Ambulatory Visit: Payer: Medicare Other

## 2020-07-05 DIAGNOSIS — E349 Endocrine disorder, unspecified: Secondary | ICD-10-CM | POA: Diagnosis not present

## 2020-07-05 DIAGNOSIS — E291 Testicular hypofunction: Secondary | ICD-10-CM

## 2020-07-05 LAB — URINALYSIS, COMPLETE
Bilirubin, UA: NEGATIVE
Glucose, UA: NEGATIVE
Ketones, UA: NEGATIVE
Leukocytes,UA: NEGATIVE
Nitrite, UA: NEGATIVE
Protein,UA: NEGATIVE
Specific Gravity, UA: 1.025 (ref 1.005–1.030)
Urobilinogen, Ur: 0.2 mg/dL (ref 0.2–1.0)
pH, UA: 5 (ref 5.0–7.5)

## 2020-07-05 LAB — MICROSCOPIC EXAMINATION
Bacteria, UA: NONE SEEN
WBC, UA: NONE SEEN /hpf (ref 0–5)

## 2020-07-06 LAB — HEMOGLOBIN AND HEMATOCRIT, BLOOD
Hematocrit: 36.2 % — ABNORMAL LOW (ref 37.5–51.0)
Hemoglobin: 12.2 g/dL — ABNORMAL LOW (ref 13.0–17.7)

## 2020-07-06 LAB — TESTOSTERONE: Testosterone: 305 ng/dL (ref 264–916)

## 2020-07-06 LAB — PSA: Prostate Specific Ag, Serum: 0.3 ng/mL (ref 0.0–4.0)

## 2020-07-06 LAB — LUTEINIZING HORMONE: LH: 4.9 m[IU]/mL (ref 1.7–8.6)

## 2020-07-09 ENCOUNTER — Other Ambulatory Visit: Payer: Self-pay

## 2020-07-12 DIAGNOSIS — M5030 Other cervical disc degeneration, unspecified cervical region: Secondary | ICD-10-CM | POA: Diagnosis not present

## 2020-07-12 DIAGNOSIS — M5136 Other intervertebral disc degeneration, lumbar region: Secondary | ICD-10-CM | POA: Diagnosis not present

## 2020-07-12 DIAGNOSIS — Z79891 Long term (current) use of opiate analgesic: Secondary | ICD-10-CM | POA: Diagnosis not present

## 2020-07-17 NOTE — Progress Notes (Signed)
07/18/2020 4:18 PM   Jared Farley 10/17/1960 [id number]  Referring provider: Coral Spikes, DO [address] [place],  [place] [address]  Chief Complaint  Patient presents with  . Hypogonadism   Urological history: 1. Testosterone deficiency -testosterone 305 in 06/2020  -was followed by Dr. Jacqlyn Larsen  2. ED -contributing factors of age, COPD, smoking, testosterone deficiency, BPH -SHIM 21 -managed with tadalafil 5 mg, daily  3. BPH with LU TS -PSA 0.3 in 06/2020 -I PSS 28/5 -managed with alfuzosin 10 mg daily  4. Chronic prostatitis  5. High risk hematuria -smoker > 30 pack year history -remote history of micro heme -has not had CTU or cysto -UA in 06/2020 negative for micro heme   HPI: Jared Farley is a 60 y.o. 60 year old male who presents today for further discussion and evaluation for ED and history of testosterone deficiency.  He states that he has not had an erections in the last 6 to 7 years.  Patient is not having spontaneous erections.   He denies any pain or curvature with erections.   He has taken the tadalafil 5 mg daily and has not seen any benefit with erections.  He has noticed a more fullness to his penis and an increase in his testicular size.     SHIM    Row Name 07/18/20 1502         SHIM: Over the last 6 months:   How do you rate your confidence that you could get and keep an erection? Very Low     When you had erections with sexual stimulation, how often were your erections hard enough for penetration (entering your partner)? Almost Always or Always     During sexual intercourse, how often were you able to maintain your erection after you had penetrated (entered) your partner? Almost Always or Always     During sexual intercourse, how difficult was it to maintain your erection to completion of intercourse? Not Difficult     When you attempted sexual intercourse, how often was it satisfactory for you? Almost Always or Always            SHIM Total Score   SHIM 21            Score: 1-7 Severe ED 8-11 Moderate ED 12-16 Mild-Moderate ED 17-21 Mild ED 22-25 No ED   He is taking the Uroxatral  Patient denies any modifying or aggravating factors.  Patient denies any gross hematuria, dysuria or suprapubic/flank pain.  Patient denies any fevers, chills, nausea or vomiting.    IPSS    Row Name 07/18/20 1500         International Prostate Symptom Score   How often have you had the sensation of not emptying your bladder? About half the time     How often have you had to urinate less than every two hours? More than half the time     How often have you found you stopped and started again several times when you urinated? More than half the time     How often have you found it difficult to postpone urination? About half the time     How often have you had a weak urinary stream? More than half the time     How often have you had to strain to start urination? Almost always     How many times did you typically get up at night to urinate? 5 Times  Total IPSS Score 28           Quality of Life due to urinary symptoms   If you were to spend the rest of your life with your urinary condition just the way it is now how would you feel about that? Unhappy            Score:  1-7 Mild 8-19 Moderate 20-35 Severe  PMH: Past Medical History:  Diagnosis Date  . Chronic pain following surgery or procedure    testicular  . Degenerative joint disease     Surgical History: No past surgical history on file.  Home Medications:  Allergies as of 07/18/2020      Reactions   Trazodone And Nefazodone    Trazodone Hcl    Pt can't recall reaction      Medication List       Accurate as of Jul 18, 2020  4:18 PM. If you have any questions, ask your nurse or doctor.        albuterol 108 (90 Base) MCG/ACT inhaler Commonly known as: VENTOLIN HFA ProAir HFA 90 mcg/actuation aerosol inhaler What changed: Another  medication with the same name was removed. Continue taking this medication, and follow the directions you see here. Changed by: [name], PA-C   alfuzosin 10 MG 24 hr tablet Commonly known as: UROXATRAL Take 1 tablet (10 mg total) by mouth daily with breakfast.   aspirin 81 MG tablet Take 81 mg by mouth daily.   Fluzone Quadrivalent 0.5 ML injection Generic drug: influenza vac split quadrivalent PF Fluzone Quad 2019-2020 (PF) 60 mcg (15 mcg x 4)/0.5 mL IM syringe   ipratropium-albuterol 0.5-2.5 (3) MG/3ML Soln Commonly known as: DUONEB SMARTSIG:3 Milliliter(s) Via Nebulizer Every 6 Hours   morphine 60 MG 12 hr tablet Commonly known as: MS CONTIN Take 60 mg by mouth 3 (three) times daily.   oxyCODONE 15 MG immediate release tablet Commonly known as: ROXICODONE oxycodone 15 mg tablet   Symbicort 160-4.5 MCG/ACT inhaler Generic drug: budesonide-formoterol Inhale into the lungs.   tadalafil 20 MG tablet Commonly known as: CIALIS Take 1 tablet (20 mg total) by mouth daily as needed for erectile dysfunction. What changed:   medication strength  how much to take  when to take this  reasons to take this Changed by: Dannie Hattabaugh, PA-C   triamcinolone cream 0.1 % Commonly known as: KENALOG Apply 1 application topically 2 (two) times daily.   VITAMIN D-3 PO Take by mouth.       Allergies:  Allergies  Allergen Reactions  . Trazodone And Nefazodone   . Trazodone Hcl     Pt can't recall reaction    Family History: No family history on file.  Social History:  reports that he has been smoking. He has been smoking about 1.00 pack per day. He has never used smokeless tobacco. He reports current alcohol use. He reports that he does not use drugs.  ROS: Pertinent ROS in HPI  Physical Exam: BP 112/73   Pulse (!) 103   Ht 6' (1.829 m)   Wt 185 lb (83.9 kg)   BMI 25.09 kg/m   Constitutional:  Well nourished. Alert and oriented, No acute  distress. HEENT: [place] AT, mask in place.  Trachea midline Cardiovascular: No clubbing, cyanosis, or edema. Respiratory: Normal respiratory effort, no increased work of breathing. Neurologic: Grossly intact, no focal deficits, moving all 4 extremities. Psychiatric: Normal mood and affect.  Laboratory Data: No new labs since last visit  Pertinent Imaging: No recent imaging  Assessment & Plan:    1. ED -I started to discuss other alternatives for erectile dysfunction such as ICI and patient stated he did not think he could tolerate any medication that was either applied or injected directly into the penis due to his chronic scrotal pain.  We decided that time for him to take tadalafil 20 mg daily to see if we can increase blood flow to his penis resulting in a satisfactory erection.  He states he has not entered stent and intercourse, but would just like to masturbate at times to relieve himself.  3. BPH -Continue to take Uroxatrol  Most of this appointment was devoted to him explaining the struggles that he has had finding physicians to manage his chronic pain syndrome.  Return in about 1 month (around 08/18/2020) for SHIM and PVR .  These notes generated with voice recognition software. I apologize for typographical errors.  [name], PA-C  [facility] [address]  [place], [place] [address] [phone]  I spent 30 minutes on the day of the encounter to include pre-visit record review, face-to-face time with the patient, and post-visit ordering of tests.

## 2020-07-18 ENCOUNTER — Ambulatory Visit: Payer: Medicare Other | Admitting: Urology

## 2020-07-18 ENCOUNTER — Other Ambulatory Visit: Payer: Self-pay

## 2020-07-18 ENCOUNTER — Encounter: Payer: Self-pay | Admitting: Urology

## 2020-07-18 VITALS — BP 112/73 | HR 103 | Ht 72.0 in | Wt 185.0 lb

## 2020-07-18 DIAGNOSIS — N401 Enlarged prostate with lower urinary tract symptoms: Secondary | ICD-10-CM

## 2020-07-18 DIAGNOSIS — N138 Other obstructive and reflux uropathy: Secondary | ICD-10-CM | POA: Diagnosis not present

## 2020-07-18 DIAGNOSIS — N529 Male erectile dysfunction, unspecified: Secondary | ICD-10-CM | POA: Diagnosis not present

## 2020-07-18 DIAGNOSIS — E349 Endocrine disorder, unspecified: Secondary | ICD-10-CM | POA: Diagnosis not present

## 2020-07-18 MED ORDER — TADALAFIL 20 MG PO TABS: 20.0000 mg | ORAL_TABLET | Freq: Every day | ORAL | 3 refills | Status: DC | PRN

## 2020-07-25 ENCOUNTER — Other Ambulatory Visit: Payer: Self-pay | Admitting: Urology

## 2020-07-25 ENCOUNTER — Telehealth: Payer: Self-pay | Admitting: Urology

## 2020-07-25 MED ORDER — TADALAFIL 20 MG PO TABS: 20.0000 mg | ORAL_TABLET | Freq: Every day | ORAL | 3 refills | Status: DC | PRN

## 2020-07-25 NOTE — Progress Notes (Signed)
Tadalafil 20 mg sent to [facility].

## 2020-07-25 NOTE — Telephone Encounter (Signed)
Pt called stating he was given coupons for Good Rx and was told to take coupon to [facility], [facility] states [facility] has [facility].  Pt asks to have Rx sent to the [facility] on [address]

## 2020-08-12 DIAGNOSIS — M5030 Other cervical disc degeneration, unspecified cervical region: Secondary | ICD-10-CM | POA: Diagnosis not present

## 2020-08-12 DIAGNOSIS — Z79891 Long term (current) use of opiate analgesic: Secondary | ICD-10-CM | POA: Diagnosis not present

## 2020-08-12 DIAGNOSIS — M5136 Other intervertebral disc degeneration, lumbar region: Secondary | ICD-10-CM | POA: Diagnosis not present

## 2020-08-19 NOTE — Progress Notes (Deleted)
08/20/2020 12:55 PM   Jared Farley 03-Jul-1960 [id number]  Referring provider: Coral Spikes, DO [address] [place],  [place] [address]  Urological history: 1. Testosterone deficiency -testosterone 305 in 06/2020  -was followed by Dr. Jacqlyn Larsen  2. ED -contributing factors of age, COPD, smoking, testosterone deficiency, BPH -SHIM 21 -managed with tadalafil 5 mg, daily  3. BPH with LU TS -PSA 0.3 in 06/2020 -I PSS 28/5 -managed with alfuzosin 10 mg daily  4. Chronic prostatitis  5. High risk hematuria -smoker > 30 pack year history -remote history of micro heme -has not had CTU or cysto -UA in 06/2020 negative for micro heme   No chief complaint on file.   HPI: Jared Farley is a 60 y.o. for one month follow up.        Score: 1-7 Severe ED 8-11 Moderate ED 12-16 Mild-Moderate ED 17-21 Mild ED 22-25 No ED    PMH: Past Medical History:  Diagnosis Date   Chronic pain following surgery or procedure    testicular   Degenerative joint disease     Surgical History: No past surgical history on file.  Home Medications:  Allergies as of 08/20/2020       Reactions   Trazodone And Nefazodone    Trazodone Hcl    Pt can't recall reaction        Medication List        Accurate as of August 19, 2020 12:55 PM. If you have any questions, ask your nurse or doctor.          albuterol 108 (90 Base) MCG/ACT inhaler Commonly known as: VENTOLIN HFA ProAir HFA 90 mcg/actuation aerosol inhaler   alfuzosin 10 MG 24 hr tablet Commonly known as: UROXATRAL Take 1 tablet (10 mg total) by mouth daily with breakfast.   aspirin 81 MG tablet Take 81 mg by mouth daily.   Fluzone Quadrivalent 0.5 ML injection Generic drug: influenza vac split quadrivalent PF Fluzone Quad 2019-2020 (PF) 60 mcg (15 mcg x 4)/0.5 mL IM syringe   ipratropium-albuterol 0.5-2.5 (3) MG/3ML Soln Commonly known as: DUONEB SMARTSIG:3 Milliliter(s) Via Nebulizer Every 6  Hours   morphine 60 MG 12 hr tablet Commonly known as: MS CONTIN Take 60 mg by mouth 3 (three) times daily.   oxyCODONE 15 MG immediate release tablet Commonly known as: ROXICODONE oxycodone 15 mg tablet   Symbicort 160-4.5 MCG/ACT inhaler Generic drug: budesonide-formoterol Inhale into the lungs.   tadalafil 20 MG tablet Commonly known as: CIALIS Take 1 tablet (20 mg total) by mouth daily as needed for erectile dysfunction.   triamcinolone cream 0.1 % Commonly known as: KENALOG Apply 1 application topically 2 (two) times daily.   VITAMIN D-3 PO Take by mouth.        Allergies:  Allergies  Allergen Reactions   Trazodone And Nefazodone    Trazodone Hcl     Pt can't recall reaction    Family History: No family history on file.  Social History:  reports that he has been smoking. He has been smoking an average of 1.00 packs per day. He has never used smokeless tobacco. He reports current alcohol use. He reports that he does not use drugs.  ROS: Pertinent ROS in HPI  Physical Exam: There were no vitals taken for this visit.  Constitutional:  Well nourished. Alert and oriented, No acute distress. HEENT:  AT, moist mucus membranes.  Trachea midline Cardiovascular: No clubbing, cyanosis, or edema. Respiratory: Normal  respiratory effort, no increased work of breathing. GI: Abdomen is soft, non tender, non distended, no abdominal masses. Liver and spleen not palpable.  No hernias appreciated.  Stool sample for occult testing is not indicated.   GU: No CVA tenderness.  No bladder fullness or masses.  Patient with circumcised/uncircumcised phallus. ***Foreskin easily retracted***  Urethral meatus is patent.  No penile discharge. No penile lesions or rashes. Scrotum without lesions, cysts, rashes and/or edema.  Testicles are located scrotally bilaterally. No masses are appreciated in the testicles. Left and right epididymis are normal. Rectal: Patient with  normal sphincter  tone. Anus and perineum without scarring or rashes. No rectal masses are appreciated. Prostate is approximately *** grams, *** nodules are appreciated. Seminal vesicles are normal. Skin: No rashes, bruises or suspicious lesions. Lymph: No inguinal adenopathy. Neurologic: Grossly intact, no focal deficits, moving all 4 extremities. Psychiatric: Normal mood and affect.   Laboratory Data: No new labs since last visit  Pertinent Imaging: No recent imaging  Assessment & Plan:    1. ED -I started to discuss other alternatives for erectile dysfunction such as ICI and patient stated he did not think he could tolerate any medication that was either applied or injected directly into the penis due to his chronic scrotal pain.  We decided that time for him to take tadalafil 20 mg daily to see if we can increase blood flow to his penis resulting in a satisfactory erection.  He states he has not entered stent and intercourse, but would just like to masturbate at times to relieve himself.  3. BPH -Continue to take Uroxatrol  Most of this appointment was devoted to him explaining the struggles that he has had finding physicians to manage his chronic pain syndrome.  No follow-ups on file.  These notes generated with voice recognition software. I apologize for typographical errors.  [name], PA-C  [facility] [address]  [place], [place] [address] [phone]

## 2020-08-20 ENCOUNTER — Ambulatory Visit: Payer: Self-pay | Admitting: Urology

## 2020-08-26 NOTE — Progress Notes (Signed)
08/27/2020 10:48 AM   [name] Nov 25, 1960 [id number]  Referring provider: Coral Spikes, DO [address] [place],   [address]  Urological history: 1. Testosterone deficiency -testosterone 305 in 06/2020  -was followed by Dr. Jacqlyn Larsen  2. ED -contributing factors of age, COPD, smoking, testosterone deficiency, BPH -SHIM 5  3. BPH with LU TS -PSA 0.3 in 06/2020 -I PSS 28/5 -managed with alfuzosin 10 mg daily  4. Chronic prostatitis  5. High risk hematuria -smoker > 30 pack year history -remote history of micro heme -has not had CTU or cysto -UA in 06/2020 negative for micro heme   Chief Complaint  Patient presents with   Hypogonadism     HPI: KEIYON PLACK is a 60 y.o. male for one month follow up.    He did not notice any difference taking the tadalafil 20 mg daily.  He stated he masturbated 1 evening and he feels the erection might have been firm enough for penetration, he achieved orgasm, but he stated he only ejaculated a drop.  Patient is not having spontaneous erections.  He denies any pain or curvature with erections.      SHIM     Row Name 08/27/20 1020         SHIM: Over the last 6 months:   How do you rate your confidence that you could get and keep an erection? Very Low     When you had erections with sexual stimulation, how often were your erections hard enough for penetration (entering your partner)? Almost Never or Never     During sexual intercourse, how often were you able to maintain your erection after you had penetrated (entered) your partner? Almost Never or Never     During sexual intercourse, how difficult was it to maintain your erection to completion of intercourse? Extremely Difficult     When you attempted sexual intercourse, how often was it satisfactory for you? Almost Never or Never           SHIM Total Score     SHIM 5              Score: 1-7 Severe ED 8-11 Moderate ED 12-16 Mild-Moderate  ED 17-21 Mild ED 22-25 No ED    PMH: Past Medical History:  Diagnosis Date   Chronic pain following surgery or procedure    testicular   Degenerative joint disease     Surgical History: History reviewed. No pertinent surgical history.  Home Medications:  Allergies as of 08/27/2020       Reactions   Trazodone And Nefazodone    Trazodone Hcl    Pt can't recall reaction        Medication List        Accurate as of August 27, 2020 10:48 AM. If you have any questions, ask your nurse or doctor.          STOP taking these medications    Fluzone Quadrivalent 0.5 ML injection Generic drug: influenza vac split quadrivalent PF Stopped by: Korynn Kenedy, PA-C   tadalafil 20 MG tablet Commonly known as: CIALIS Stopped by: Karey Stucki, PA-C       TAKE these medications    albuterol 108 (90 Base) MCG/ACT inhaler Commonly known as: VENTOLIN HFA ProAir HFA 90 mcg/actuation aerosol inhaler   alfuzosin 10 MG 24 hr tablet Commonly known as: UROXATRAL Take 1 tablet (10 mg total) by mouth daily with breakfast.   aspirin 81 MG  tablet Take 81 mg by mouth daily.   ipratropium-albuterol 0.5-2.5 (3) MG/3ML Soln Commonly known as: DUONEB SMARTSIG:3 Milliliter(s) Via Nebulizer Every 6 Hours   morphine 60 MG 12 hr tablet Commonly known as: MS CONTIN Take 60 mg by mouth 3 (three) times daily.   oxyCODONE 15 MG immediate release tablet Commonly known as: ROXICODONE oxycodone 15 mg tablet   predniSONE 10 MG tablet Commonly known as: DELTASONE Take by mouth.   sildenafil 100 MG tablet Commonly known as: VIAGRA Take 1 tablet (100 mg total) by mouth daily as needed for erectile dysfunction. Started by: [name], PA-C   Symbicort 160-4.5 MCG/ACT inhaler Generic drug: budesonide-formoterol Inhale into the lungs.   triamcinolone cream 0.1 % Commonly known as: KENALOG Apply 1 application topically 2 (two) times daily.   VITAMIN D-3 PO Take by mouth.         Allergies:  Allergies  Allergen Reactions   Trazodone And Nefazodone    Trazodone Hcl     Pt can't recall reaction    Family History: History reviewed. No pertinent family history.  Social History:  reports that he has been smoking cigarettes. He has been smoking an average of 1.00 packs per day. He has never used smokeless tobacco. He reports current alcohol use. He reports that he does not use drugs.  ROS: Pertinent ROS in HPI  Physical Exam: BP 115/73   Pulse 86   Ht 6' (1.829 m)   Wt 179 lb 14.4 oz (81.6 kg)   BMI 24.40 kg/m   Constitutional:  Well nourished. Alert and oriented, No acute distress. HEENT: [place] AT, mask in place.  Trachea midline Cardiovascular: No clubbing, cyanosis, or edema. Respiratory: Normal respiratory effort, no increased work of breathing. Neurologic: Grossly intact, no focal deficits, moving all 4 extremities. Psychiatric: Normal mood and affect.   Laboratory Data: No new labs since last visit  Pertinent Imaging: No recent imaging  Assessment & Plan:    1. ED -Still not interested in pursuing more invasive treatment options for his ED such as ICI secondary to his chronic pelvic pain -We will have a trial of sildenafil 100 mg to take daily to see if he can achieve satisfactory erections -He will follow-up in 1 month for SH IM  2. Ejaculation disorder -Explained to the patient that as he ages his ejaculate fluid will decrease -BPH is also contributing to the ejaculation disorder -Also explained that Uroxatrol has a side effect of retrograde ejaculation  3. BPH -Continue to take Uroxatrol   Return in about 1 month (around 09/26/2020) for SHIM .  These notes generated with voice recognition software. I apologize for typographical errors.  [name], PA-C  [facility] [address]  [place], [place] [address] [phone]

## 2020-08-27 ENCOUNTER — Encounter: Payer: Self-pay | Admitting: Urology

## 2020-08-27 ENCOUNTER — Other Ambulatory Visit: Payer: Self-pay

## 2020-08-27 ENCOUNTER — Ambulatory Visit (INDEPENDENT_AMBULATORY_CARE_PROVIDER_SITE_OTHER): Payer: Medicare Other | Admitting: Urology

## 2020-08-27 VITALS — BP 115/73 | HR 86 | Ht 72.0 in | Wt 179.9 lb

## 2020-08-27 DIAGNOSIS — N138 Other obstructive and reflux uropathy: Secondary | ICD-10-CM

## 2020-08-27 DIAGNOSIS — N529 Male erectile dysfunction, unspecified: Secondary | ICD-10-CM | POA: Diagnosis not present

## 2020-08-27 DIAGNOSIS — N401 Enlarged prostate with lower urinary tract symptoms: Secondary | ICD-10-CM

## 2020-08-27 MED ORDER — SILDENAFIL CITRATE 100 MG PO TABS: 100.0000 mg | ORAL_TABLET | Freq: Every day | ORAL | 3 refills | Status: DC | PRN

## 2020-08-28 DIAGNOSIS — H40013 Open angle with borderline findings, low risk, bilateral: Secondary | ICD-10-CM | POA: Diagnosis not present

## 2020-09-10 DIAGNOSIS — M5136 Other intervertebral disc degeneration, lumbar region: Secondary | ICD-10-CM | POA: Diagnosis not present

## 2020-09-10 DIAGNOSIS — Z79891 Long term (current) use of opiate analgesic: Secondary | ICD-10-CM | POA: Diagnosis not present

## 2020-09-24 NOTE — Progress Notes (Signed)
09/25/2020 2:21 PM   Jared Farley [birth year] [id number]  Referring provider: Coral Spikes, DO [address] [place],  [place] [address]  Urological history: 1. Testosterone deficiency -testosterone 305 in 06/2020  -was followed by Dr. Jacqlyn Larsen  2. ED -contributing factors of age, COPD, smoking, testosterone deficiency, BPH -SHIM 5  3. BPH with LU TS -PSA 0.3 in 06/2020 -I PSS 28/5 -managed with alfuzosin 10 mg daily  4. Chronic prostatitis  5. High risk hematuria -smoker > 30 pack year history -remote history of micro heme -has not had CTU or cysto -UA in 06/2020 negative for micro heme   Chief Complaint  Patient presents with   Erectile Dysfunction      HPI: Jared Farley is a 60 y.o. male for one month follow up.    He stated that he has noticed an increase in the size of his penis with the sildenafil 100 mg daily  He still has no libido and the sildenafil did not improve erections.    Patient is not having spontaneous erections.  He denies any pain or curvature with erections.    He also states that he has lost all his hair on his legs bilaterally and he used to have very hairy legs.  He filled out his shim today in relationship to his past experience with sexual activity and not how it is today.    SHIM     Row Name 09/25/20 1415         SHIM: Over the last 6 months:   How do you rate your confidence that you could get and keep an erection? Very Low     When you had erections with sexual stimulation, how often were your erections hard enough for penetration (entering your partner)? Almost Always or Always     During sexual intercourse, how often were you able to maintain your erection after you had penetrated (entered) your partner? Almost Always or Always     During sexual intercourse, how difficult was it to maintain your erection to completion of intercourse? Not Difficult     When you attempted sexual intercourse, how often was it  satisfactory for you? Almost Always or Always           SHIM Total Score     SHIM 21               Score: 1-7 Severe ED 8-11 Moderate ED 12-16 Mild-Moderate ED 17-21 Mild ED 22-25 No ED    PMH: Past Medical History:  Diagnosis Date   Chronic pain following surgery or procedure    testicular   Degenerative joint disease     Surgical History: No past surgical history on file.  Home Medications:  Allergies as of 09/25/2020       Reactions   Trazodone And Nefazodone    Trazodone Hcl    Pt can't recall reaction        Medication List        Accurate as of September 25, 2020  2:21 PM. If you have any questions, ask your nurse or doctor.          albuterol 108 (90 Base) MCG/ACT inhaler Commonly known as: VENTOLIN HFA ProAir HFA 90 mcg/actuation aerosol inhaler   alfuzosin 10 MG 24 hr tablet Commonly known as: UROXATRAL Take 1 tablet (10 mg total) by mouth daily with breakfast.   aspirin 81 MG tablet Take 81 mg by mouth daily.  ipratropium-albuterol 0.5-2.5 (3) MG/3ML Soln Commonly known as: DUONEB SMARTSIG:3 Milliliter(s) Via Nebulizer Every 6 Hours   morphine 60 MG 12 hr tablet Commonly known as: MS CONTIN Take 60 mg by mouth 3 (three) times daily.   oxyCODONE 15 MG immediate release tablet Commonly known as: ROXICODONE oxycodone 15 mg tablet   predniSONE 10 MG tablet Commonly known as: DELTASONE Take by mouth.   sildenafil 100 MG tablet Commonly known as: VIAGRA Take 1 tablet (100 mg total) by mouth daily as needed for erectile dysfunction.   Symbicort 160-4.5 MCG/ACT inhaler Generic drug: budesonide-formoterol Inhale into the lungs.   triamcinolone cream 0.1 % Commonly known as: KENALOG Apply 1 application topically 2 (two) times daily.   VITAMIN D-3 PO Take by mouth.        Allergies:  Allergies  Allergen Reactions   Trazodone And Nefazodone    Trazodone Hcl     Pt can't recall reaction    Family History: No family  history on file.  Social History:  reports that he has been smoking cigarettes. He has been smoking an average of 1 pack per day. He has never used smokeless tobacco. He reports current alcohol use. He reports that he does not use drugs.  ROS: Pertinent ROS in HPI  Physical Exam: BP 120/70   Pulse 98   Ht '5\' 11"'$  (1.803 m)   Wt 183 lb (83 kg)   BMI 25.52 kg/m   Constitutional:  Well nourished. Alert and oriented, No acute distress. HEENT: [place] AT, mask in place.  Trachea midline Cardiovascular: No clubbing, cyanosis, or edema. Respiratory: Normal respiratory effort, no increased work of breathing. Psychiatric: Normal mood and affect.  Laboratory Data: No new labs since last visit  Pertinent Imaging: No recent imaging  Assessment & Plan:    1. ED -Still no improvement with erections while taking the sildenafil 100 mg daily, but he states he has noticed his penis is more fuller and is back to normal when flaccid -Continue sildenafil 100 mg daily  2. Low libido -Patient does have a history of low testosterone had been on AndroGel in the past.  His most recent testosterone was 305, so I consider starting Clomid 50 mg 1/2 tablet daily -He will return in 1 month for a morning testosterone draw   These notes generated with voice recognition software. I apologize for typographical errors.  [name], PA-C  [facility] [address]  [place], [place] [address] [phone]

## 2020-09-25 ENCOUNTER — Other Ambulatory Visit: Payer: Self-pay

## 2020-09-25 ENCOUNTER — Ambulatory Visit (INDEPENDENT_AMBULATORY_CARE_PROVIDER_SITE_OTHER): Payer: Medicare Other | Admitting: Urology

## 2020-09-25 ENCOUNTER — Encounter: Payer: Self-pay | Admitting: Urology

## 2020-09-25 VITALS — BP 120/70 | HR 98 | Ht 71.0 in | Wt 183.0 lb

## 2020-09-25 DIAGNOSIS — N529 Male erectile dysfunction, unspecified: Secondary | ICD-10-CM

## 2020-09-25 DIAGNOSIS — R6882 Decreased libido: Secondary | ICD-10-CM

## 2020-09-25 MED ORDER — CLOMIPHENE CITRATE 50 MG PO TABS: ORAL_TABLET | ORAL | 3 refills | Status: DC

## 2020-10-08 ENCOUNTER — Other Ambulatory Visit: Payer: Self-pay

## 2020-10-08 DIAGNOSIS — E349 Endocrine disorder, unspecified: Secondary | ICD-10-CM

## 2020-10-11 DIAGNOSIS — M5136 Other intervertebral disc degeneration, lumbar region: Secondary | ICD-10-CM | POA: Diagnosis not present

## 2020-10-11 DIAGNOSIS — Z79891 Long term (current) use of opiate analgesic: Secondary | ICD-10-CM | POA: Diagnosis not present

## 2020-10-28 ENCOUNTER — Other Ambulatory Visit: Payer: Self-pay

## 2020-10-28 ENCOUNTER — Other Ambulatory Visit: Payer: Medicare Other

## 2020-10-28 DIAGNOSIS — E349 Endocrine disorder, unspecified: Secondary | ICD-10-CM | POA: Diagnosis not present

## 2020-10-29 ENCOUNTER — Other Ambulatory Visit: Payer: Self-pay | Admitting: *Deleted

## 2020-10-29 DIAGNOSIS — N529 Male erectile dysfunction, unspecified: Secondary | ICD-10-CM

## 2020-10-29 DIAGNOSIS — E349 Endocrine disorder, unspecified: Secondary | ICD-10-CM

## 2020-10-29 DIAGNOSIS — N138 Other obstructive and reflux uropathy: Secondary | ICD-10-CM

## 2020-10-29 DIAGNOSIS — E291 Testicular hypofunction: Secondary | ICD-10-CM

## 2020-10-29 LAB — TESTOSTERONE: Testosterone: 699 ng/dL (ref 264–916)

## 2020-11-13 DIAGNOSIS — M5136 Other intervertebral disc degeneration, lumbar region: Secondary | ICD-10-CM | POA: Diagnosis not present

## 2020-11-13 DIAGNOSIS — Z79891 Long term (current) use of opiate analgesic: Secondary | ICD-10-CM | POA: Diagnosis not present

## 2020-12-12 DIAGNOSIS — Z79891 Long term (current) use of opiate analgesic: Secondary | ICD-10-CM | POA: Diagnosis not present

## 2020-12-12 DIAGNOSIS — M5136 Other intervertebral disc degeneration, lumbar region: Secondary | ICD-10-CM | POA: Diagnosis not present

## 2021-01-09 DIAGNOSIS — Z79891 Long term (current) use of opiate analgesic: Secondary | ICD-10-CM | POA: Diagnosis not present

## 2021-01-09 DIAGNOSIS — M5136 Other intervertebral disc degeneration, lumbar region: Secondary | ICD-10-CM | POA: Diagnosis not present

## 2021-01-28 DIAGNOSIS — H40013 Open angle with borderline findings, low risk, bilateral: Secondary | ICD-10-CM | POA: Diagnosis not present

## 2021-02-02 ENCOUNTER — Other Ambulatory Visit: Payer: Self-pay | Admitting: Urology

## 2021-02-07 ENCOUNTER — Other Ambulatory Visit: Payer: Self-pay | Admitting: Internal Medicine

## 2021-02-07 DIAGNOSIS — G894 Chronic pain syndrome: Secondary | ICD-10-CM | POA: Diagnosis not present

## 2021-02-07 DIAGNOSIS — R0609 Other forms of dyspnea: Secondary | ICD-10-CM | POA: Diagnosis not present

## 2021-02-07 DIAGNOSIS — J441 Chronic obstructive pulmonary disease with (acute) exacerbation: Secondary | ICD-10-CM | POA: Diagnosis not present

## 2021-02-07 DIAGNOSIS — M4800 Spinal stenosis, site unspecified: Secondary | ICD-10-CM | POA: Diagnosis not present

## 2021-02-07 DIAGNOSIS — Z79899 Other long term (current) drug therapy: Secondary | ICD-10-CM | POA: Diagnosis not present

## 2021-02-07 DIAGNOSIS — M47812 Spondylosis without myelopathy or radiculopathy, cervical region: Secondary | ICD-10-CM | POA: Diagnosis not present

## 2021-02-07 DIAGNOSIS — F32A Depression, unspecified: Secondary | ICD-10-CM | POA: Diagnosis not present

## 2021-02-07 DIAGNOSIS — Z0000 Encounter for general adult medical examination without abnormal findings: Secondary | ICD-10-CM | POA: Diagnosis not present

## 2021-02-10 DIAGNOSIS — Z79891 Long term (current) use of opiate analgesic: Secondary | ICD-10-CM | POA: Diagnosis not present

## 2021-02-10 DIAGNOSIS — M5136 Other intervertebral disc degeneration, lumbar region: Secondary | ICD-10-CM | POA: Diagnosis not present

## 2021-04-24 ENCOUNTER — Other Ambulatory Visit: Payer: Medicare Other

## 2021-04-24 ENCOUNTER — Encounter: Payer: Self-pay | Admitting: Urology

## 2021-04-28 NOTE — Progress Notes (Unsigned)
04/29/2021 12:58 PM   Jared Farley 10-19-1960 [id number]  Referring provider: Coral Spikes, DO [place],  [place] [address]  No chief complaint on file.  Urological history: 1. Testosterone deficiency -contributing factors of age and history of chronic pain medication use -testosterone level pending -H & H pending -Hepatic panel pending  -managed with Clomid 50 mg, 1/2 tablet daily   2. ED -contributing factors of age, COPD, smoking, testosterone deficiency, BPH -SHIM *** -managed with sildenafil 100 mg, on-demand-dosing  3. BPH with LU TS -PSA pending -I PSS *** -managed with alfuzosin 10 mg daily  4. Chronic prostatitis  5. High risk hematuria -smoker > 30 pack year history -remote history of micro heme -has not had CTU or cysto -no reports of gross heme -UA ***  HPI: Jared Farley is a 61 y.o. male for 6 month follow up.      Score:  1-7 Mild 8-19 Moderate 20-35 Severe      Score: 1-7 Severe ED 8-11 Moderate ED 12-16 Mild-Moderate ED 17-21 Mild ED 22-25 No ED     PMH: Past Medical History:  Diagnosis Date   Chronic pain following surgery or procedure    testicular   Degenerative joint disease     Surgical History: No past surgical history on file.  Home Medications:  Allergies as of 04/29/2021       Reactions   Trazodone And Nefazodone    Trazodone Hcl    Pt can't recall reaction        Medication List        Accurate as of April 28, 2021 12:58 PM. If you have any questions, ask your nurse or doctor.          albuterol 108 (90 Base) MCG/ACT inhaler Commonly known as: VENTOLIN HFA ProAir HFA 90 mcg/actuation aerosol inhaler   alfuzosin 10 MG 24 hr tablet Commonly known as: UROXATRAL Take 1 tablet (10 mg total) by mouth daily with breakfast.   aspirin 81 MG tablet Take 81 mg by mouth daily.   clomiPHENE 50 MG tablet Commonly known as: CLOMID Take 1/2 tablet daily    ipratropium-albuterol 0.5-2.5 (3) MG/3ML Soln Commonly known as: DUONEB SMARTSIG:3 Milliliter(s) Via Nebulizer Every 6 Hours   morphine 60 MG 12 hr tablet Commonly known as: MS CONTIN Take 60 mg by mouth 3 (three) times daily.   oxyCODONE 15 MG immediate release tablet Commonly known as: ROXICODONE oxycodone 15 mg tablet   predniSONE 10 MG tablet Commonly known as: DELTASONE Take by mouth.   sildenafil 100 MG tablet Commonly known as: VIAGRA TAKE 1 TABLET BY MOUTH ONCE DAILY AS NEEDED FOR ERECTILE DYSFUNCTION   Symbicort 160-4.5 MCG/ACT inhaler Generic drug: budesonide-formoterol Inhale into the lungs.   triamcinolone cream 0.1 % Commonly known as: KENALOG Apply 1 application topically 2 (two) times daily.   VITAMIN D-3 PO Take by mouth.        Allergies:  Allergies  Allergen Reactions   Trazodone And Nefazodone    Trazodone Hcl     Pt can't recall reaction    Family History: No family history on file.  Social History:  reports that he has been smoking cigarettes. He has been smoking an average of 1 pack per day. He has never used smokeless tobacco. He reports current alcohol use. He reports that he does not use drugs.  ROS: Pertinent ROS in HPI  Physical Exam: There were no vitals taken for this  visit.  Constitutional:  Well nourished. Alert and oriented, No acute distress. HEENT: [place] AT, moist mucus membranes.  Trachea midline Cardiovascular: No clubbing, cyanosis, or edema. Respiratory: Normal respiratory effort, no increased work of breathing. GU: No CVA tenderness.  No bladder fullness or masses.  Patient with circumcised/uncircumcised phallus. ***Foreskin easily retracted***  Urethral meatus is patent.  No penile discharge. No penile lesions or rashes. Scrotum without lesions, cysts, rashes and/or edema.  Testicles are located scrotally bilaterally. No masses are appreciated in the testicles. Left and right epididymis are normal. Rectal: Patient with   normal sphincter tone. Anus and perineum without scarring or rashes. No rectal masses are appreciated. Prostate is approximately *** grams, *** nodules are appreciated. Seminal vesicles are normal. Neurologic: Grossly intact, no focal deficits, moving all 4 extremities. Psychiatric: Normal mood and affect.   Laboratory Data: Hemoglobin A1C 4.2 - 5.6 % 5.7 High    Average Blood Glucose (Calc) mg/dL [place]  Narrative Performed by [facility] - LAB Normal Range:    4.2 - 5.6%  Increased Risk:  5.7 - 6.4%  Diabetes:        >= 6.5%  Glycemic Control for adults with diabetes:  <7%   Specimen Collected: 02/07/21 11:22 Last Resulted: 02/07/21 15:59  Received From: [facility]  Result Received: 02/10/21 12:03   Cholesterol, Total 100 - 200 mg/dL 173   Triglyceride 35 - 199 mg/dL 48   HDL (High Density Lipoprotein) Cholesterol 29.0 - 71.0 mg/dL 51.6   LDL Calculated 0 - 130 mg/dL 112   VLDL Cholesterol mg/dL 10   Cholesterol/HDL Ratio  3.4   Resulting Agency  [facility] - LAB  Specimen Collected: 02/07/21 11:22 Last Resulted: 02/07/21 15:06  Received From: [facility]  Result Received: 02/10/21 12:03   Thyroid Stimulating Hormone (TSH) 0.450-5.330 uIU/ml uIU/mL 2.462   Resulting Agency  [facility] - LAB  Specimen Collected: 02/07/21 11:22 Last Resulted: 02/07/21 15:06  Received From: [facility]  Result Received: 02/10/21 12:03  I have reviewed the labs.    Pertinent Imaging: ***  Assessment & Plan:    1. Testosterone deficiency ***  2. BPH with LUTS -PSA stable -DRE benign -UA benign -PVR < 300 cc -symptoms - *** -most bothersome symptoms are *** -continue conservative management, avoiding bladder irritants and timed voiding's -Initiate alpha-blocker (***), discussed side effects *** -Initiate 5 alpha reductase inhibitor (***), discussed side effects  *** -Continue tamsulosin 0.4 mg daily, alfuzosin 10 mg daily, Rapaflo 8 mg daily, terazosin, doxazosin, Cialis 5 mg daily and finasteride 5 mg daily, dutasteride 0.5 mg daily***:refills given -Cannot tolerate medication or medication failure, schedule cystoscopy ***    3. ED -***  These notes generated with voice recognition software. I apologize for typographical errors.  [name], PA-C  [facility] [address]  [place], [place] [address] [phone]

## 2021-04-29 ENCOUNTER — Encounter: Payer: Self-pay | Admitting: Urology

## 2021-04-29 ENCOUNTER — Ambulatory Visit: Payer: Medicare Other | Admitting: Urology

## 2021-04-29 DIAGNOSIS — N529 Male erectile dysfunction, unspecified: Secondary | ICD-10-CM

## 2021-04-29 DIAGNOSIS — E349 Endocrine disorder, unspecified: Secondary | ICD-10-CM

## 2021-04-29 DIAGNOSIS — N138 Other obstructive and reflux uropathy: Secondary | ICD-10-CM

## 2021-06-03 NOTE — Progress Notes (Deleted)
? ? ?06/04/2021 ?9:04 PM  ? ?[name] ?01/14/1961 ?[id number] ? ?Referring provider: Coral Spikes, DO ?[address] ?[address] ?[place],  [place] [id number] ? ?Urological history: ?1. Testosterone deficiency ?-testosterone 305 in 06/2020  ?-was followed by Dr. Jacqlyn Larsen ?-managed with clomiphene 50 mg, 1/2 daily  ? ?2. ED ?-contributing factors of age, COPD, smoking, testosterone deficiency, BPH ?-SHIM 5 ?-managed with sildenafil 100 mg on-demand-dosing ? ?3. BPH with LU TS ?-PSA 0.3 in 06/2020 ?-I PSS *** ?-managed with alfuzosin 10 mg daily ? ?4. Chronic prostatitis ? ?5. High risk hematuria ?-smoker > 30 pack year history ?-remote history of micro heme ?-has not had CTU or cysto ?-UA in 06/2020 negative for micro heme  ? ?No chief complaint on file. ? ?  ? ?HPI: ?Jared Farley is a 61 y.o. male for six month follow up.   ? ? ? ?Score:  ?1-7 Mild ?8-19 Moderate ?20-35 Severe  ? ? ? ? ? ?Score: ?1-7 Severe ED ?8-11 Moderate ED ?12-16 Mild-Moderate ED ?17-21 Mild ED ?22-25 No ED ? ? ? ?PMH: ?Past Medical History:  ?Diagnosis Date  ? Chronic pain following surgery or procedure   ? testicular  ? Degenerative joint disease   ? ? ?Surgical History: ?No past surgical history on file. ? ?Home Medications:  ?Allergies as of 06/04/2021   ? ?   Reactions  ? Trazodone And Nefazodone   ? Trazodone Hcl   ? Pt can't recall reaction  ? ?  ? ?  ?Medication List  ?  ? ?  ? Accurate as of June 03, 2021  9:04 PM. If you have any questions, ask your nurse or doctor.  ?  ?  ? ?  ? ?albuterol 108 (90 Base) MCG/ACT inhaler ?Commonly known as: VENTOLIN HFA ?ProAir HFA 90 mcg/actuation aerosol inhaler ?  ?alfuzosin 10 MG 24 hr tablet ?Commonly known as: UROXATRAL ?Take 1 tablet (10 mg total) by mouth daily with breakfast. ?  ?aspirin 81 MG tablet ?Take 81 mg by mouth daily. ?  ?clomiPHENE 50 MG tablet ?Commonly known as: CLOMID ?Take 1/2 tablet daily ?  ?ipratropium-albuterol 0.5-2.5 (3) MG/3ML Soln ?Commonly known as: DUONEB ?SMARTSIG:3  Milliliter(s) Via Nebulizer Every 6 Hours ?  ?morphine 60 MG 12 hr tablet ?Commonly known as: MS CONTIN ?Take 60 mg by mouth 3 (three) times daily. ?  ?oxyCODONE 15 MG immediate release tablet ?Commonly known as: ROXICODONE ?oxycodone 15 mg tablet ?  ?predniSONE 10 MG tablet ?Commonly known as: DELTASONE ?Take by mouth. ?  ?sildenafil 100 MG tablet ?Commonly known as: VIAGRA ?TAKE 1 TABLET BY MOUTH ONCE DAILY AS NEEDED FOR ERECTILE DYSFUNCTION ?  ?Symbicort 160-4.5 MCG/ACT inhaler ?Generic drug: budesonide-formoterol ?Inhale into the lungs. ?  ?triamcinolone cream 0.1 % ?Commonly known as: KENALOG ?Apply 1 application topically 2 (two) times daily. ?  ?VITAMIN D-3 PO ?Take by mouth. ?  ? ?  ? ? ?Allergies:  ?Allergies  ?Allergen Reactions  ? Trazodone And Nefazodone   ? Trazodone Hcl   ?  Pt can't recall reaction  ? ? ?Family History: ?No family history on file. ? ?Social History:  reports that he has been smoking cigarettes. He has been smoking an average of 1 pack per day. He has never used smokeless tobacco. He reports current alcohol use. He reports that he does not use drugs. ? ?ROS: ?Pertinent ROS in HPI ? ?Physical Exam: ?There were no vitals taken for this visit.  ?Constitutional:  Well nourished.  Alert and oriented, No acute distress. ?HEENT: [place] AT, moist mucus membranes.  Trachea midline ?Cardiovascular: No clubbing, cyanosis, or edema. ?Respiratory: Normal respiratory effort, no increased work of breathing. ?GU: No CVA tenderness.  No bladder fullness or masses.  Patient with circumcised/uncircumcised phallus. ***Foreskin easily retracted***  Urethral meatus is patent.  No penile discharge. No penile lesions or rashes. Scrotum without lesions, cysts, rashes and/or edema.  Testicles are located scrotally bilaterally. No masses are appreciated in the testicles. Left and right epididymis are normal. ?Rectal: Patient with  normal sphincter tone. Anus and perineum without scarring or rashes. No rectal masses  are appreciated. Prostate is approximately *** grams, *** nodules are appreciated. Seminal vesicles are normal. ?Neurologic: Grossly intact, no focal deficits, moving all 4 extremities. ?Psychiatric: Normal mood and affect.  ? ?Laboratory Data: ?Glucose 70 - 110 mg/dL 103   ?Sodium 136 - 145 mmol/L 140   ?Potassium 3.6 - 5.1 mmol/L 4.7   ?Chloride 97 - 109 mmol/L 105   ?Carbon Dioxide (CO2) 22.0 - 32.0 mmol/L 32.5 High    ?Urea Nitrogen (BUN) 7 - 25 mg/dL 9   ?Creatinine 0.7 - 1.3 mg/dL 1.1   ?Glomerular Filtration Rate (eGFR), MDRD Estimate >60 mL/min/1.73sq m 69   ?Calcium 8.7 - 10.3 mg/dL 8.8   ?AST  8 - 39 U/L 17   ?ALT  6 - 57 U/L 9   ?Alk Phos (alkaline Phosphatase) 34 - 104 U/L 49   ?Albumin 3.5 - 4.8 g/dL 4.2   ?Bilirubin, Total 0.3 - 1.2 mg/dL 0.5   ?Protein, Total 6.1 - 7.9 g/dL 7.4   ?A/G Ratio 1.0 - 5.0 gm/dL 1.3   ?Resulting Agency  [facility]  ?Specimen Collected: 02/07/21 11:22 Last Resulted: 02/07/21 15:02  ?Received From: [facility]  Result Received: 02/10/21 12:03  ? ?Hemoglobin A1C 4.2 - 5.6 % 5.7 High    ?Average Blood Glucose (Calc) mg/dL 117   ?Resulting Agency  [facility]  ?Narrative ?Performed by [facility] ?Normal Range:    4.2 - 5.6%  ?Increased Risk:  5.7 - 6.4%  ?Diabetes:        >= 6.5%  ?Glycemic Control for adults with diabetes:  <7%   ?Specimen Collected: 02/07/21 11:22 Last Resulted: 02/07/21 15:59  ?Received From: [facility]  Result Received: 02/10/21 12:03  ? ?Cholesterol, Total 100 - 200 mg/dL 173   ?Triglyceride 35 - 199 mg/dL 48   ?HDL (High Density Lipoprotein) Cholesterol 29.0 - 71.0 mg/dL 51.6   ?LDL Calculated 0 - 130 mg/dL 112   ?VLDL Cholesterol mg/dL 10   ?Cholesterol/HDL Ratio  3.4   ?Resulting Agency  [facility]  ?Specimen Collected: 02/07/21 11:22 Last Resulted: 02/07/21 15:06  ?Received From: [facility]  Result Received: 02/10/21 12:03  ? ?Thyroid  Stimulating Hormone (TSH) 0.450-5.330 uIU/ml uIU/mL 2.462   ?Resulting Agency  [facility]  ?Specimen Collected: 02/07/21 11:22 Last Resulted: 02/07/21 15:06  ?Received From: [facility]  Result Received: 02/10/21 12:03  ? ?WBC (White Blood Cell Count) 4.1 - 10.2 10?3/uL 8.8   ?RBC (Red Blood Cell Count) 4.69 - 6.13 10?6/uL 3.93 Low    ?Hemoglobin 14.1 - 18.1 gm/dL 12.7 Low    ?Hematocrit 40.0 - 52.0 % 38.4 Low    ?MCV (Mean Corpuscular Volume) 80.0 - 100.0 fl 97.7   ?MCH (Mean Corpuscular Hemoglobin) 27.0 - 31.2 pg  32.3 High    ?MCHC (Mean Corpuscular Hemoglobin Concentration) 32.0 - 36.0 gm/dL 33.1   ?Platelet Count 150 - 450 10?3/uL 231   ?RDW-CV (Red Cell Distribution Width) 11.6 - 14.8 % 13.4   ?MPV (Mean Platelet Volume) 9.4 - 12.4 fl 9.2 Low    ?Neutrophils 1.50 - 7.80 10?3/uL 4.95   ?Lymphocytes 1.00 - 3.60 10?3/uL 3.04   ?Monocytes 0.00 - 1.50 10?3/uL 0.47   ?Eosinophils 0.00 - 0.55 10?3/uL 0.21   ?Basophils 0.00 - 0.09 10?3/uL 0.06   ?Neutrophil % 32.0 - 70.0 % 56.6   ?Lymphocyte % 10.0 - 50.0 % 34.7   ?Monocyte % 4.0 - 13.0 % 5.4   ?Eosinophil % 1.0 - 5.0 % 2.4   ?Basophil% 0.0 - 2.0 % 0.7   ?Immature Granulocyte % <=0.7 % 0.2   ?Immature Granulocyte Count <=0.06 10^3/?L 0.02   ?Resulting Agency  [facility]  ?Specimen Collected: 02/07/21 11:22 Last Resulted: 02/07/21 14:41  ?Received From: [facility]  Result Received: 02/07/21 14:58  ?I have reviewed the labs.  ? ? ?Pertinent Imaging: ?No recent imaging ? ?Assessment & Plan:   ? ?1. ED ?-Still no improvement with erections while taking the sildenafil 100 mg daily, but he states he has noticed his penis is more fuller and is back to normal when flaccid ?-Continue sildenafil 100 mg daily ? ?2. Low libido ?-Patient does have a history of low testosterone had been on AndroGel in the past.  His most recent testosterone was 305, so I consider starting Clomid 50 mg 1/2 tablet daily ?-He will  return in 1 month for a morning testosterone draw ? ? ?These notes generated with voice recognition software. I apologize for typographical errors. ? ?Jeronimo Hellberg, PA-C ? ?[facility] ?[place]

## 2021-06-04 ENCOUNTER — Ambulatory Visit: Payer: Medicare Other | Admitting: Urology

## 2021-06-04 ENCOUNTER — Encounter: Payer: Self-pay | Admitting: Urology

## 2021-06-04 DIAGNOSIS — E349 Endocrine disorder, unspecified: Secondary | ICD-10-CM

## 2021-06-04 DIAGNOSIS — N529 Male erectile dysfunction, unspecified: Secondary | ICD-10-CM

## 2021-06-04 DIAGNOSIS — N138 Other obstructive and reflux uropathy: Secondary | ICD-10-CM

## 2021-06-18 ENCOUNTER — Other Ambulatory Visit: Payer: Self-pay | Admitting: Specialist

## 2021-06-18 DIAGNOSIS — R0609 Other forms of dyspnea: Secondary | ICD-10-CM

## 2021-06-18 DIAGNOSIS — J439 Emphysema, unspecified: Secondary | ICD-10-CM

## 2021-06-18 DIAGNOSIS — F17210 Nicotine dependence, cigarettes, uncomplicated: Secondary | ICD-10-CM

## 2021-06-23 ENCOUNTER — Ambulatory Visit
Admission: RE | Admit: 2021-06-23 | Discharge: 2021-06-23 | Disposition: A | Discharge status: Home / Self Care | Payer: Medicare Other | Source: Ambulatory Visit | Attending: Specialist | Admitting: Specialist

## 2021-06-23 DIAGNOSIS — R0609 Other forms of dyspnea: Secondary | ICD-10-CM

## 2021-06-23 DIAGNOSIS — J439 Emphysema, unspecified: Secondary | ICD-10-CM

## 2021-06-23 DIAGNOSIS — F17210 Nicotine dependence, cigarettes, uncomplicated: Secondary | ICD-10-CM

## 2021-07-07 NOTE — Progress Notes (Signed)
? ? ?07/08/2021 ?4:28 PM  ? ?Jared Farley ?Jun 20, 1960 ?[id number] ? ?Referring provider: Coral Spikes, DO ?[address] ?[address] ?[place],  [place] [address] ? ?Urological history: ?1. Testosterone deficiency ?-contributing factors of age and chronic opioid use ?-testosterone pending ?-WNL pending ?-LFT's pending ?-clomid 50 mg, 1/2 tablet daily ? ?2. ED ?-contributing factors of age, COPD, smoking, testosterone deficiency, BPH ?-sildenafil 100 mg, on-demand-dosing ? ?3. BPH with LU TS ?-PSA pending ?-alfuzosin 10 mg daily ? ?4. Chronic prostatitis ? ?5. High risk hematuria ?-smoker > 30 pack year history ?-remote history of micro heme ?-has not had CTU or cysto ?-UA in 06/2020 negative for micro heme  ? ?Chief Complaint  ?Patient presents with  ? Benign Prostatic Hypertrophy  ? Erectile Dysfunction  ? ?  ? ?HPI: ?Jared Farley is a 61 y.o. male for 6 month follow up.   ? ?When patient presented to the front desk, staff said that he looked very pale and was holding his stomach.  He was then brought back for phlebotomy and proceeded to have a witnessed grand mal seizure and vomited per labs staff.  He states he has a history of seizure disorder. ? ?When I saw the patient, he was awake and eating crackers and did not seem to be in a postictal state.  He states that he had vomited 3 times prior to coming to the office and he had not ate any food as he only eats 1 meal a day. ? ? IPSS   ? ? [place] Name 07/08/21 1500  ?  ?  ?  ? International Prostate Symptom Score  ? How often have you had the sensation of not emptying your bladder? Less than half the time    ? How often have you had to urinate less than every two hours? Less than 1 in 5 times    ? How often have you found you stopped and started again several times when you urinated? About half the time    ? How often have you found it difficult to postpone urination? Not at All    ? How often have you had a weak urinary stream? More than half the time    ? How often have  you had to strain to start urination? More than half the time    ? How many times did you typically get up at night to urinate? 2 Times    ? Total IPSS Score 16    ?  ? Quality of Life due to urinary symptoms  ? If you were to spend the rest of your life with your urinary condition just the way it is now how would you feel about that? Mostly Disatisfied    ? ?  ?  ? ?  ? ? ?Score:  ?1-7 Mild ?8-19 Moderate ?20-35 Severe   ? ? SHIM   ? ? [name] 07/08/21 1519  ?  ?  ?  ? SHIM: Over the last 6 months:  ? How do you rate your confidence that you could get and keep an erection? Very Low    ? When you had erections with sexual stimulation, how often were your erections hard enough for penetration (entering your partner)? Almost Always or Always    ? During sexual intercourse, how often were you able to maintain your erection after you had penetrated (entered) your partner? Almost Always or Always    ? During sexual intercourse, how difficult was it to  maintain your erection to completion of intercourse? Not Difficult    ? When you attempted sexual intercourse, how often was it satisfactory for you? Almost Always or Always    ?  ? SHIM Total Score  ? SHIM 21    ? ?  ?  ? ?  ? ? ? ? ? ?Score: ?1-7 Severe ED ?8-11 Moderate ED ?12-16 Mild-Moderate ED ?17-21 Mild ED ?22-25 No ED ? ? ? ?PMH: ?Past Medical History:  ?Diagnosis Date  ? Chronic pain following surgery or procedure   ? testicular  ? Degenerative joint disease   ? ? ?Surgical History: ?No past surgical history on file. ? ?Home Medications:  ?Allergies as of 07/08/2021   ? ?   Reactions  ? Trazodone And Nefazodone   ? Trazodone Hcl   ? Pt can't recall reaction  ? ?  ? ?  ?Medication List  ?  ? ?  ? Accurate as of Jul 08, 2021  4:28 PM. If you have any questions, ask your nurse or doctor.  ?  ?  ? ?  ? ?albuterol 108 (90 Base) MCG/ACT inhaler ?Commonly known as: VENTOLIN HFA ?ProAir HFA 90 mcg/actuation aerosol inhaler ?  ?alfuzosin 10 MG 24 hr tablet ?Commonly known  as: UROXATRAL ?Take 1 tablet (10 mg total) by mouth daily with breakfast. ?  ?aspirin 81 MG tablet ?Take 81 mg by mouth daily. ?  ?clomiPHENE 50 MG tablet ?Commonly known as: CLOMID ?Take 1/2 tablet daily ?  ?ipratropium-albuterol 0.5-2.5 (3) MG/3ML Soln ?Commonly known as: DUONEB ?SMARTSIG:3 Milliliter(s) Via Nebulizer Every 6 Hours ?  ?morphine 60 MG 12 hr tablet ?Commonly known as: MS CONTIN ?Take 60 mg by mouth 3 (three) times daily. ?  ?oxyCODONE 15 MG immediate release tablet ?Commonly known as: ROXICODONE ?oxycodone 15 mg tablet ?  ?predniSONE 10 MG tablet ?Commonly known as: DELTASONE ?Take by mouth. ?  ?sildenafil 100 MG tablet ?Commonly known as: VIAGRA ?TAKE 1 TABLET BY MOUTH ONCE DAILY AS NEEDED FOR ERECTILE DYSFUNCTION ?  ?Symbicort 160-4.5 MCG/ACT inhaler ?Generic drug: budesonide-formoterol ?Inhale into the lungs. ?  ?triamcinolone cream 0.1 % ?Commonly known as: KENALOG ?Apply 1 application topically 2 (two) times daily. ?  ?VITAMIN D-3 PO ?Take by mouth. ?  ? ?  ? ? ?Allergies:  ?Allergies  ?Allergen Reactions  ? Trazodone And Nefazodone   ? Trazodone Hcl   ?  Pt can't recall reaction  ? ? ?Family History: ?No family history on file. ? ?Social History:  reports that he has been smoking cigarettes. He has been smoking an average of 1 pack per day. He has never used smokeless tobacco. He reports current alcohol use. He reports that he does not use drugs. ? ?ROS: ?Pertinent ROS in HPI ? ? ?Laboratory Data: ?Glucose 70 - 110 mg/dL 103   ?Sodium 136 - 145 mmol/L 140   ?Potassium 3.6 - 5.1 mmol/L 4.7   ?Chloride 97 - 109 mmol/L 105   ?Carbon Dioxide (CO2) 22.0 - 32.0 mmol/L 32.5 High    ?Urea Nitrogen (BUN) 7 - 25 mg/dL 9   ?Creatinine 0.7 - 1.3 mg/dL 1.1   ?Glomerular Filtration Rate (eGFR), MDRD Estimate >60 mL/min/1.73sq m 69   ?Calcium 8.7 - 10.3 mg/dL 8.8   ?AST  8 - 39 U/L 17   ?ALT  6 - 57 U/L 9   ?Alk Phos (alkaline Phosphatase) 34 - 104 U/L 49   ?Albumin 3.5 - 4.8 g/dL 4.2   ?Bilirubin, Total 0.3  -  1.2 mg/dL 0.5   ?Protein, Total 6.1 - 7.9 g/dL 7.4   ?A/G Ratio 1.0 - 5.0 gm/dL 1.3   ?Resulting Agency  [facility]  ?Specimen Collected: 02/07/21 11:22 Last Resulted: 02/07/21 15:02  ?Received From: [facility]  Result Received: 02/10/21 12:03  ? ?Hemoglobin A1C 4.2 - 5.6 % 5.7 High    ?Average Blood Glucose (Calc) mg/dL 117   ?Resulting Agency  [facility]  ?Narrative ?Performed by [facility] ?Normal Range:    4.2 - 5.6%  ?Increased Risk:  5.7 - 6.4%  ?Diabetes:        >= 6.5%  ?Glycemic Control for adults with diabetes:  <7%   ?Specimen Collected: 02/07/21 11:22 Last Resulted: 02/07/21 15:59  ?Received From: [facility]  Result Received: 02/10/21 12:03  ? ?Cholesterol, Total 100 - 200 mg/dL 173   ?Triglyceride 35 - 199 mg/dL 48   ?HDL (High Density Lipoprotein) Cholesterol 29.0 - 71.0 mg/dL 51.6   ?LDL Calculated 0 - 130 mg/dL 112   ?VLDL Cholesterol mg/dL 10   ?Cholesterol/HDL Ratio  3.4   ?Resulting Agency  [facility]  ?Specimen Collected: 02/07/21 11:22 Last Resulted: 02/07/21 15:06  ?Received From: [facility]  Result Received: 02/10/21 12:03  ? ?Thyroid Stimulating Hormone (TSH) 0.450-5.330 uIU/ml uIU/mL 2.462   ?Resulting Agency  [facility]  ?Specimen Collected: 02/07/21 11:22 Last Resulted: 02/07/21 15:06  ?Received From: [facility]  Result Received: 02/10/21 12:03  ? ?WBC (White Blood Cell Count) 4.1 - 10.2 10?3/uL 8.8   ?RBC (Red Blood Cell Count) 4.69 - 6.13 10?6/uL 3.93 Low    ?Hemoglobin 14.1 - 18.1 gm/dL 12.7 Low    ?Hematocrit 40.0 - 52.0 % 38.4 Low    ?MCV (Mean Corpuscular Volume) 80.0 - 100.0 fl 97.7   ?MCH (Mean Corpuscular Hemoglobin) 27.0 - 31.2 pg 32.3 High    ?MCHC (Mean Corpuscular Hemoglobin Concentration) 32.0 - 36.0 gm/dL 33.1   ?Platelet Count 150 - 450 10?3/uL 231   ?RDW-CV (Red Cell Distribution Width) 11.6 - 14.8 % 13.4   ?MPV (Mean  Platelet Volume) 9.4 - 12.4 fl 9.2 Low    ?Neutrophils 1.50 - 7.80 10?3/uL 4.95   ?Lymphocytes 1.00 - 3.60 10?3/uL 3.04   ?Monocytes 0.00 - 1.50 10?3/uL 0.47   ?Eosinophils 0.00 - 0.55 10?3/uL 0.21   ?B

## 2021-07-08 ENCOUNTER — Encounter: Payer: Self-pay | Admitting: Urology

## 2021-07-08 ENCOUNTER — Ambulatory Visit: Payer: Medicare Other | Admitting: Urology

## 2021-07-08 ENCOUNTER — Emergency Department
Admission: EM | Admit: 2021-07-08 | Discharge: 2021-07-08 | Discharge status: Against Medical Advice | Payer: Medicare Other | Source: Home / Self Care

## 2021-07-08 DIAGNOSIS — N401 Enlarged prostate with lower urinary tract symptoms: Secondary | ICD-10-CM

## 2021-07-08 DIAGNOSIS — N529 Male erectile dysfunction, unspecified: Secondary | ICD-10-CM

## 2021-07-08 DIAGNOSIS — E291 Testicular hypofunction: Secondary | ICD-10-CM | POA: Diagnosis not present

## 2021-07-08 DIAGNOSIS — E349 Endocrine disorder, unspecified: Secondary | ICD-10-CM

## 2021-07-08 DIAGNOSIS — R569 Unspecified convulsions: Secondary | ICD-10-CM | POA: Diagnosis not present

## 2021-07-08 DIAGNOSIS — N411 Chronic prostatitis: Secondary | ICD-10-CM

## 2021-07-08 DIAGNOSIS — N138 Other obstructive and reflux uropathy: Secondary | ICD-10-CM

## 2021-07-09 LAB — HEPATIC FUNCTION PANEL
ALT: 9 IU/L (ref 0–44)
AST: 19 IU/L (ref 0–40)
Albumin: 4.3 g/dL (ref 3.8–4.9)
Alkaline Phosphatase: 66 IU/L (ref 44–121)
Bilirubin Total: 0.5 mg/dL (ref 0.0–1.2)
Bilirubin, Direct: 0.12 mg/dL (ref 0.00–0.40)
Total Protein: 7 g/dL (ref 6.0–8.5)

## 2021-07-09 LAB — PSA: Prostate Specific Ag, Serum: 0.4 ng/mL (ref 0.0–4.0)

## 2021-07-09 LAB — TESTOSTERONE: Testosterone: 246 ng/dL — ABNORMAL LOW (ref 264–916)

## 2021-07-15 ENCOUNTER — Telehealth: Payer: Self-pay

## 2021-07-15 NOTE — Telephone Encounter (Signed)
OK per DPR, LMOM asking pt to please return call to make missed office visit appt.  ?

## 2021-07-15 NOTE — Telephone Encounter (Signed)
-----   Message from Nori Riis, PA-C sent at 07/14/2021  9:20 PM EDT ----- ?Mr. Carrick needs to get rescheduled from his missed appointment last week.  ?

## 2021-07-18 NOTE — Telephone Encounter (Signed)
Attempt #2, no answer, VM full.

## 2021-08-04 ENCOUNTER — Other Ambulatory Visit: Payer: Self-pay | Admitting: Urology

## 2021-09-22 ENCOUNTER — Other Ambulatory Visit: Payer: Medicare Other

## 2021-09-22 DIAGNOSIS — N401 Enlarged prostate with lower urinary tract symptoms: Secondary | ICD-10-CM

## 2021-09-22 DIAGNOSIS — E291 Testicular hypofunction: Secondary | ICD-10-CM

## 2021-09-22 DIAGNOSIS — E349 Endocrine disorder, unspecified: Secondary | ICD-10-CM

## 2021-09-22 DIAGNOSIS — N529 Male erectile dysfunction, unspecified: Secondary | ICD-10-CM

## 2021-09-23 LAB — HEPATIC FUNCTION PANEL
ALT: 12 IU/L (ref 0–44)
AST: 20 IU/L (ref 0–40)
Albumin: 4.1 g/dL (ref 3.8–4.9)
Alkaline Phosphatase: 66 IU/L (ref 44–121)
Bilirubin Total: <0.2 mg/dL (ref 0.0–1.2)
Bilirubin, Direct: <0.1 mg/dL (ref 0.00–0.40)
Total Protein: 6.9 g/dL (ref 6.0–8.5)

## 2021-09-23 LAB — PSA: Prostate Specific Ag, Serum: 0.6 ng/mL (ref 0.0–4.0)

## 2021-09-23 LAB — TESTOSTERONE: Testosterone: 240 ng/dL — ABNORMAL LOW (ref 264–916)

## 2021-09-23 LAB — HEMOGLOBIN AND HEMATOCRIT, BLOOD
Hematocrit: 37.2 % — ABNORMAL LOW (ref 37.5–51.0)
Hemoglobin: 13.1 g/dL (ref 13.0–17.7)

## 2021-09-29 NOTE — Progress Notes (Unsigned)
09/30/2021 1:34 PM   Jared Farley [birth year] [id number]  Referring provider: Coral Spikes, DO [place],  [place] [address]  Urological history: 1. Testosterone deficiency -contributing factors of age and chronic opioid use -testosterone, 08/2021 - 240 -hemoglobin/hematocrit, 08/2021 - 13.1/37.2 -hepatic panel, 08/2021 - WNL  2. ED -contributing factors of age, COPD, smoking, testosterone deficiency, BPH -SHIM 5 -sildenafil 100 mg, on-demand-dosing  3. BPH with LU TS -PSA, 08/2021 - 0.6 -I PSS 24/4 -alfuzosin 10 mg daily  4. Chronic prostatitis  5. High risk hematuria -smoker > 30 pack year history -remote history of micro heme -has not had CTU or cysto -UA 11-30 RBC's   Chief Complaint  Patient presents with   Benign Prostatic Hypertrophy      HPI: Jared Farley is a 61 y.o. male for 6 month follow up.    He has noticed a worsening of his urinary symptoms (nocturia, frequency, urgency and feelings of incomplete bladder emptying).  Patient denies any modifying or aggravating factors.  Patient denies any gross hematuria, dysuria or suprapubic/flank pain.  Patient denies any fevers, chills, nausea or vomiting.    UA 11-30 RBC's.   IPSS     Row Name 09/30/21 1300         International Prostate Symptom Score   How often have you had the sensation of not emptying your bladder? Less than 1 in 5     How often have you had to urinate less than every two hours? More than half the time     How often have you found you stopped and started again several times when you urinated? Almost always     How often have you found it difficult to postpone urination? Less than half the time     How often have you had a weak urinary stream? More than half the time     How often have you had to strain to start urination? More than half the time     How many times did you typically get up at night to urinate? 4 Times     Total IPSS Score 24       Quality of  Life due to urinary symptoms   If you were to spend the rest of your life with your urinary condition just the way it is now how would you feel about that? Mostly Disatisfied               Score:  1-7 Mild 8-19 Moderate 20-35 Severe    Patient is not having spontaneous erections.  He denies any pain or curvature with erections.  He has failed PDE5i's.   SHIM     Row Name 09/30/21 1308         SHIM: Over the last 6 months:   How do you rate your confidence that you could get and keep an erection? Very Low     When you had erections with sexual stimulation, how often were your erections hard enough for penetration (entering your partner)? Almost Never or Never     During sexual intercourse, how often were you able to maintain your erection after you had penetrated (entered) your partner? Almost Never or Never     During sexual intercourse, how difficult was it to maintain your erection to completion of intercourse? Extremely Difficult     When you attempted sexual intercourse, how often was it satisfactory for you? Almost Never or Never  SHIM Total Score   SHIM 5                  Score: 1-7 Severe ED 8-11 Moderate ED 12-16 Mild-Moderate ED 17-21 Mild ED 22-25 No ED    PMH: Past Medical History:  Diagnosis Date   Chronic pain following surgery or procedure    testicular   Degenerative joint disease     Surgical History: History reviewed. No pertinent surgical history.  Home Medications:  Allergies as of 09/30/2021       Reactions   Trazodone And Nefazodone    Trazodone Hcl    Pt can't recall reaction        Medication List        Accurate as of September 30, 2021  1:34 PM. If you have any questions, ask your nurse or doctor.          STOP taking these medications    albuterol 108 (90 Base) MCG/ACT inhaler Commonly known as: VENTOLIN HFA Stopped by: Ameah Chanda, PA-C   clomiPHENE 50 MG tablet Commonly known as: CLOMID Stopped  by: [name], PA-C   Symbicort 160-4.5 MCG/ACT inhaler Generic drug: budesonide-formoterol Stopped by: [name], PA-C   Trelegy Ellipta 200-62.5-25 MCG/ACT Aepb Generic drug: Fluticasone-Umeclidin-Vilant Stopped by: [name], PA-C       TAKE these medications    alfuzosin 10 MG 24 hr tablet Commonly known as: UROXATRAL Take 1 tablet (10 mg total) by mouth daily with breakfast.   aspirin 81 MG tablet Take 81 mg by mouth daily.   ipratropium-albuterol 0.5-2.5 (3) MG/3ML Soln Commonly known as: DUONEB SMARTSIG:3 Milliliter(s) Via Nebulizer Every 6 Hours   lidocaine 5 % Commonly known as: LIDODERM 1 patch daily.   morphine 60 MG 12 hr tablet Commonly known as: MS CONTIN Take 60 mg by mouth 3 (three) times daily.   oxyCODONE 15 MG immediate release tablet Commonly known as: ROXICODONE oxycodone 15 mg tablet   predniSONE 10 MG tablet Commonly known as: DELTASONE Take by mouth.   sildenafil 100 MG tablet Commonly known as: VIAGRA TAKE 1 TABLET BY MOUTH ONCE DAILY AS NEEDED FOR ERECTILE DYSFUNCTION   triamcinolone cream 0.1 % Commonly known as: KENALOG Apply 1 application topically 2 (two) times daily.   VITAMIN D-3 PO Take by mouth.        Allergies:  Allergies  Allergen Reactions   Trazodone And Nefazodone    Trazodone Hcl     Pt can't recall reaction    Family History: History reviewed. No pertinent family history.  Social History:  reports that he has been smoking cigarettes. He has been smoking an average of 1 pack per day. He has never used smokeless tobacco. He reports current alcohol use. He reports that he does not use drugs.  ROS: Pertinent ROS in HPI  Physical Exam: Blood pressure 105/66, pulse 92, height '5\' 11"'$  (1.803 m), weight 180 lb (81.6 kg).  Constitutional:  Well nourished. Alert and oriented, No acute distress. HEENT: [place] AT, moist mucus membranes.  Trachea midline Cardiovascular: No clubbing, cyanosis, or  edema. Respiratory: Normal respiratory effort, no increased work of breathing. Neurologic: Grossly intact, no focal deficits, moving all 4 extremities. Psychiatric: Normal mood and affect.    Laboratory Data:  Prostate Specific Ag, Serum  Latest Ref Rng 0.0 - 4.0 ng/mL  07/05/2020 0.3   07/08/2021 0.4   09/22/2021 0.6    Component     Latest Ref Rng 09/22/2021  Hemoglobin  13.0 - 17.7 g/dL 13.1   HCT     37.5 - 51.0 % 37.2 (L)     Legend: (L) Low  Component     Latest Ref Rng 09/22/2021  Testosterone     264 - 916 ng/dL 240 (L)     Legend: (L) Low  Component     Latest Ref Rng 09/22/2021  Total Protein     6.0 - 8.5 g/dL 6.9   Albumin     3.8 - 4.9 g/dL 4.1   AST     0 - 40 IU/L 20   ALT     0 - 44 IU/L 12   Alkaline Phosphatase     44 - 121 IU/L 66   Total Bilirubin     0.0 - 1.2 mg/dL <0.2   BILIRUBIN, DIRECT     0.00 - 0.40 mg/dL <0.10   I have reviewed the labs.   Pertinent Imaging: N/A  Assessment & Plan:    1. Testosterone deficiency  -testosterone levels are subtherapeutic -H & H anemic -restart Clomid 50 mg, 1/2 tablet daily   2. BPH with LUTS -PSA stable -UA with micro heme -most bothersome symptoms are frequency, incontinence and weak stream -continue conservative management, avoiding bladder irritants and timed voiding's -Continue alfuzosin 10 mg daily  3. Erectile dysfunction:    - failed PDE5i's  - not interested in further treatment  4. Microscopic hematuria - I explained to the patient that there are a number of causes that can be associated with blood in the urine, such as stones, BPH, UTI's, damage to the urinary tract and/or cancer. -at this time, they are in a high risk stratification for hematuria per AUA guidelines due to their age (>60 years , > 30 pack year smoking history and  > 25 RBC's -recommended studies for high risk are CT urogram and cysto - I explained to the patient that a contrast material will be injected into a  vein and that in rare instances, an allergic reaction can result and may even life threatening (1:100,000)  The patient denies any allergies to contrast, iodine and/or seafood and is not taking metformin. - Following the imaging study,  I've recommended a cystoscopy. I described how this is performed, typically in an office setting with a flexible cystoscope. We described the risks, benefits, and possible side effects, the most common of which is a minor amount of blood in the urine and/or burning which usually resolves in 24 to 48 hours.   - The patient had the opportunity to ask questions which were answered. Based upon this discussion, the patient is willing to proceed. Therefore, I've ordered: a CT Urogram and cystoscopy. - The patient will return following all of the above for discussion of the results.  - UA - Urine culture -he states that due to his PTSD, he will need to be heavily sedated for the cysto     These notes generated with voice recognition software. I apologize for typographical errors.  [name], PA-C  [facility] [address]  [place], [place] [address] [phone]

## 2021-09-30 ENCOUNTER — Other Ambulatory Visit: Payer: Self-pay | Admitting: Urology

## 2021-09-30 ENCOUNTER — Telehealth: Payer: Self-pay | Admitting: *Deleted

## 2021-09-30 ENCOUNTER — Ambulatory Visit: Payer: Medicare Other | Admitting: Urology

## 2021-09-30 ENCOUNTER — Encounter: Payer: Self-pay | Admitting: Urology

## 2021-09-30 VITALS — BP 105/66 | HR 92 | Ht 71.0 in | Wt 180.0 lb

## 2021-09-30 DIAGNOSIS — N529 Male erectile dysfunction, unspecified: Secondary | ICD-10-CM

## 2021-09-30 DIAGNOSIS — N401 Enlarged prostate with lower urinary tract symptoms: Secondary | ICD-10-CM | POA: Diagnosis not present

## 2021-09-30 DIAGNOSIS — N138 Other obstructive and reflux uropathy: Secondary | ICD-10-CM

## 2021-09-30 DIAGNOSIS — E291 Testicular hypofunction: Secondary | ICD-10-CM

## 2021-09-30 DIAGNOSIS — R3129 Other microscopic hematuria: Secondary | ICD-10-CM

## 2021-09-30 DIAGNOSIS — E349 Endocrine disorder, unspecified: Secondary | ICD-10-CM

## 2021-09-30 LAB — URINALYSIS, COMPLETE
Bilirubin, UA: NEGATIVE
Glucose, UA: NEGATIVE
Ketones, UA: NEGATIVE
Leukocytes,UA: NEGATIVE
Nitrite, UA: NEGATIVE
Specific Gravity, UA: 1.03 (ref 1.005–1.030)
Urobilinogen, Ur: 0.2 mg/dL (ref 0.2–1.0)
pH, UA: 5 (ref 5.0–7.5)

## 2021-09-30 LAB — MICROSCOPIC EXAMINATION

## 2021-09-30 MED ORDER — TESTOSTERONE 20.25 MG/1.25GM (1.62%) TD GEL: 2.0000 | Freq: Every day | TRANSDERMAL | 3 refills | Status: DC

## 2021-09-30 MED ORDER — CLOMIPHENE CITRATE 50 MG PO TABS: ORAL_TABLET | ORAL | 1 refills | Status: DC

## 2021-09-30 NOTE — Telephone Encounter (Signed)
Patient  states the clomid is to  much . He would like to go back on the gel.

## 2021-09-30 NOTE — Telephone Encounter (Addendum)
I have sent in the prescription for the AndroGel.  He needs to do 2 pumps daily, preferably after a shower, to dry intact skin.  We then will need to repeat a testosterone level in 1 month.  Notified patient as instructed, patient pleased.
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
## 2021-10-02 ENCOUNTER — Ambulatory Visit (INDEPENDENT_AMBULATORY_CARE_PROVIDER_SITE_OTHER): Payer: Medicare Other | Admitting: Psychiatry

## 2021-10-02 ENCOUNTER — Encounter: Payer: Self-pay | Admitting: Psychiatry

## 2021-10-02 VITALS — BP 123/75 | HR 84 | Temp 98.3°F | Wt 183.6 lb

## 2021-10-02 DIAGNOSIS — G4710 Hypersomnia, unspecified: Secondary | ICD-10-CM

## 2021-10-02 DIAGNOSIS — F0630 Mood disorder due to known physiological condition, unspecified: Secondary | ICD-10-CM

## 2021-10-02 DIAGNOSIS — F4310 Post-traumatic stress disorder, unspecified: Secondary | ICD-10-CM

## 2021-10-05 LAB — CULTURE, URINE COMPREHENSIVE

## 2021-10-14 ENCOUNTER — Ambulatory Visit: Admission: RE | Admit: 2021-10-14 | Payer: Medicare Other | Source: Ambulatory Visit

## 2021-10-24 NOTE — Progress Notes (Unsigned)
BH MD/PA/NP OP Progress Note  10/28/2021 12:14 PM Jared Farley  MRN:  [id number]  Chief Complaint:  Chief Complaint  Patient presents with   Follow-up   HPI:  This is a follow-up appointment for mood disorder, PTSD.  He states that he continues to feel fatigued during the day.  He sleeps from 3 AM to 6 PM.  He had insomnia last night as he was anxious due to this appointment.  He does not play piano anymore due to anhedonia.  He feels depressed.  He talks about having nightmares of providers doing procedure to his testicle.  He continues to have testicular pain.  He is concerned that he would not be able to find a provider if anything happened to his pain specialist.  He thinks others would think of him as junky.  He has not been able to make an appointment with sleep specialist, although he is willing to do so.  He has the greatest in appetite.  He denies decreased need for sleep, euphonia.  He denies hallucinations or paranoia.  He denies SI as long as he is able to get pain medication from his provider.  He is willing to have discussion about his concern if anything were to happen to his provider.  He denies alcohol use. He uses marijuana for nausea secondary to opioid.   Jared Farley, his brother presents to the interview.  Jared Farley has been living with Jared Farley for the past several years.  He is not aware of how Jared Farley was doing when he was admitted years ago.  Jared Farley recognizes Jared Farley is in testicular pain, and is depressed.  Jared Farley is not aware of any episodes consistent with mania or psychosis.  Jared Farley knows that Jared Farley has hypersomnia. Jared Farley denies any safety concern, and has agreed with the plan as below.  Wt Readings from Last 3 Encounters:  10/28/21 182 lb 12.8 oz (82.9 kg)  10/02/21 183 lb 9.6 oz (83.3 kg)  09/30/21 180 lb (81.6 kg)     Visit Diagnosis:    ICD-10-CM   1. Hypersomnia  G47.10     2. PTSD (post-traumatic stress disorder)  F43.10     3. Mood disorder in conditions classified  elsewhere  F06.30       Past Psychiatric History: Please see initial evaluation for full details. I have reviewed the history. No updates at this time.     Past Medical History:  Past Medical History:  Diagnosis Date   Chronic pain following surgery or procedure    testicular   COPD (chronic obstructive pulmonary disease) ([place])    Degenerative joint disease    Seizures ([place])     Past Surgical History:  Procedure Laterality Date   ANKLE SURGERY     NASAL FRACTURE SURGERY     VASECTOMY      Family Psychiatric History: Please see initial evaluation for full details. I have reviewed the history. No updates at this time.     Family History:  Family History  Problem Relation Age of Onset   Depression Mother    Depression Brother    Suicidality Brother     Social History:  Social History   Socioeconomic History   Marital status: Divorced    Spouse name: Not on file   Number of children: 0   Years of education: Not on file   Highest education level: Some college, no degree  Occupational History   Not on file  Tobacco Use   Smoking status: Every Day  Packs/day: 1.00    Types: Cigarettes   Smokeless tobacco: Never  Vaping Use   Vaping Use: Some days   Substances: Nicotine, Nicotine-salt  Substance and Sexual Activity   Alcohol use: Yes    Comment: rarely   Drug use: Yes    Types: Marijuana   Sexual activity: Not Currently  Other Topics Concern   Not on file  Social History Narrative   Not on file   Social Determinants of Health   Financial Resource Strain: Not on file  Food Insecurity: Not on file  Transportation Needs: Not on file  Physical Activity: Not on file  Stress: Not on file  Social Connections: Not on file    Allergies:  Allergies  Allergen Reactions   Trazodone And Nefazodone    Trazodone Hcl     Pt can't recall reaction    Metabolic Disorder Labs: No results found for: "HGBA1C", "MPG" No results found for: "PROLACTIN" Lab  Results  Component Value Date   CHOL 191 05/17/2015   TRIG 71 05/17/2015   HDL 53 05/17/2015   CHOLHDL 3.6 05/17/2015   VLDL 14 05/17/2015   LDLCALC 124 05/17/2015   LDLCALC 100 (H) 11/27/2013   Lab Results  Component Value Date   TSH 0.19 (L) 11/27/2013   TSH 2.49 05/04/2012    Therapeutic Level Labs: No results found for: "LITHIUM" No results found for: "VALPROATE" No results found for: "CBMZ"  Current Medications: Current Outpatient Medications  Medication Sig Dispense Refill   albuterol (VENTOLIN HFA) 108 (90 Base) MCG/ACT inhaler Inhale into the lungs.     alfuzosin (UROXATRAL) 10 MG 24 hr tablet Take 1 tablet (10 mg total) by mouth daily with breakfast. 30 tablet 11   aspirin 81 MG tablet Take 81 mg by mouth daily.     Cholecalciferol (VITAMIN D-3 PO) Take by mouth.     clomiPHENE (CLOMID) 50 MG tablet Take 1/2 tablet daily 30 tablet 1   ipratropium-albuterol (DUONEB) 0.5-2.5 (3) MG/3ML SOLN SMARTSIG:3 Milliliter(s) Via Nebulizer Every 6 Hours     lamoTRIgine (LAMICTAL) 25 MG tablet Take 1 tablet (25 mg total) by mouth daily for 14 days, THEN 2 tablets (50 mg total) daily. 106 tablet 0   lidocaine (LIDODERM) 5 % 1 patch daily.     morphine (MS CONTIN) 60 MG 12 hr tablet Take 60 mg by mouth 3 (three) times daily.     oxyCODONE (ROXICODONE) 15 MG immediate release tablet oxycodone 15 mg tablet     predniSONE (DELTASONE) 10 MG tablet Take by mouth.     sildenafil (VIAGRA) 100 MG tablet TAKE 1 TABLET BY MOUTH ONCE DAILY AS NEEDED FOR ERECTILE DYSFUNCTION 30 tablet 6   Testosterone 20.25 MG/1.25GM (1.62%) GEL Place 2 Pump onto the skin daily. Apply AndroGel 1.62% only to your shoulders and upper arms that will be covered by a short-sleeve t-shirt. 150 g 3   triamcinolone cream (KENALOG) 0.1 % Apply 1 application topically 2 (two) times daily. 30 g 0   No current facility-administered medications for this visit.     Musculoskeletal: Strength & Muscle Tone: within normal  limits Gait & Station: normal Patient leans: N/A  Psychiatric Specialty Exam: Review of Systems  Psychiatric/Behavioral:  Positive for dysphoric mood and sleep disturbance. Negative for agitation, behavioral problems, confusion, decreased concentration, hallucinations, self-injury and suicidal ideas. The patient is nervous/anxious. The patient is not hyperactive.   All other systems reviewed and are negative.   Blood pressure 126/76, pulse 79, temperature 98.3  F (36.8 C), temperature source Temporal, weight 182 lb 12.8 oz (82.9 kg).Body mass index is 25.5 kg/m.  General Appearance: Fairly Groomed  Eye Contact:  Fair  Speech:  Clear and Coherent  Volume:  Normal  Mood:  Depressed  Affect:  Appropriate, Congruent, and down  Thought Process:  Coherent  Orientation:  Full (Time, Place, and Person)  Thought Content: Logical   Suicidal Thoughts:  No  Homicidal Thoughts:  No  Memory:  Immediate;   Good  Judgement:  Good  Insight:  Present  Psychomotor Activity:  Normal  Concentration:  Concentration: Good and Attention Span: Good  Recall:  Good  Fund of Knowledge: Good  Language: Good  Akathisia:  No  Handed:  Right  AIMS (if indicated): not done  Assets:  Communication Skills Desire for Improvement  ADL's:  Intact  Cognition: WNL  Sleep:   hypersomnia   Screenings: GAD-7    Flowsheet Row Office Visit from 10/28/2021 in [facility] Visit from 10/02/2021 in [facility]  Total GAD-7 Score 12 11      PHQ2-9    [place] Visit from 10/28/2021 in [facility] Office Visit from 10/02/2021 in [place] Visit from 02/26/2014 in [facility]  PHQ-2 Total Score '5 6 6  '$ PHQ-9 Total Score '17 20 21      '$ Jared Farley Office Visit from 10/28/2021 in [place]         Assessment and Plan:  AMADOU KATZENSTEIN is a 61 y.o. year old male with a history of PTSD, COPD, erectile dysfunction, chronic pain, who presents for follow up appointment for below.    1. Hypersomnia He continues to report hypersomnia. Differential includes narcolepsy, CO2 retention due to COPD, and depression.  Referral was made for evaluation of narcolepsy.  He was advised to contact the clinic to make an appointment.  He was also advised to contact his pulmonologist to evaluate possible CO2 retention.   2. PTSD (post-traumatic stress disorder) 3. Mood disorder in conditions classified elsewhere Exam is notable for grimacing due to pain, although he is calm through the entire interview. Psychosocial stressors includes loss of his mother a few years ago, groin pain after a vasectomy, although it has been well managed with the current opioid regimen. . He has extensive psychiatry history, which includes schizoaffective disorder, schizophrenia and bipolar disorder and lethal suicide attempts and aggressions to others, which required several psychiatric admission [facility] the [facility] hospital. According to the patient, he has not taken any psychotropics for more than decade, and denies any suicide attempt, aggression or psychotic symptoms since the last admission in 1990's. Noted that he claims all of his symptoms to adverse reaction from psychotropics. Record is not available in my chart. Psychiatrists, who he used to see in the past were whether out of the state, or deceased.  His brother, who lives with the patient over the past several years denies any significant manic episode/psychosis.  Will start lamotrigine to target bipolar depression.  Discussed potential risk of Stevens-Johnson syndrome.  Will consider adding antidepressant in the future to target PTSD as well.   # Seizure  He denies any episodes of seizure except he had seizure-like episode when he was drawn lab.  Will continue to  monitor this.   Plan  Referral for evaluation of hypersomnia Referral to rule out seizure He was advised to contact  his pulmonologist to discuss his hypersomnia He will bring his brother to his next appointment for more collaterals (Record from his previous psychiatrists are not available) Next appointment : 10/19 at 2 PM for 30 mins, in person  I have reviewed suicide assessment in detail. Updated as follows. The patient demonstrates the following risk factors for suicide: Chronic risk factors for suicide include: psychiatric disorder of mood disorder, PTSD, previous suicide attempts of overdosing phenobarbital, slicing his wrist, and chronic pain. Acute risk factors for suicide include: unemployment. Protective factors for this patient include: positive social support and hope for the future. Considering these factors, the overall suicide risk at this point appears to be moderate, but not at imminent risk. Patient is appropriate for outpatient follow up. Emergency resources which includes 911, ED, suicide crisis line [phone]) are discussed.   Collaboration of Care: Collaboration of Care: Other communicated with her brother  Patient/Guardian was advised Release of Information must be obtained prior to any record release in order to collaborate their care with an outside provider. Patient/Guardian was advised if they have not already done so to contact the registration department to sign all necessary forms in order for [place] to release information regarding their care.   Consent: Patient/Guardian gives verbal consent for treatment and assignment of benefits for services provided during this visit. Patient/Guardian expressed understanding and agreed to proceed.    Norman Clay, MD 10/28/2021, 12:14 PM

## 2021-10-28 ENCOUNTER — Ambulatory Visit (INDEPENDENT_AMBULATORY_CARE_PROVIDER_SITE_OTHER): Payer: Medicare Other | Admitting: Psychiatry

## 2021-10-28 ENCOUNTER — Encounter: Payer: Self-pay | Admitting: Psychiatry

## 2021-10-28 VITALS — BP 126/76 | HR 79 | Temp 98.3°F | Wt 182.8 lb

## 2021-10-28 DIAGNOSIS — F0630 Mood disorder due to known physiological condition, unspecified: Secondary | ICD-10-CM

## 2021-10-28 DIAGNOSIS — F4310 Post-traumatic stress disorder, unspecified: Secondary | ICD-10-CM

## 2021-10-28 DIAGNOSIS — G4710 Hypersomnia, unspecified: Secondary | ICD-10-CM | POA: Diagnosis not present

## 2021-10-28 MED ORDER — LAMOTRIGINE 25 MG PO TABS: ORAL_TABLET | ORAL | 0 refills | Status: DC

## 2021-11-04 ENCOUNTER — Ambulatory Visit: Payer: Medicare Other | Admitting: Urology

## 2021-11-25 ENCOUNTER — Ambulatory Visit: Payer: Medicare Other | Admitting: Urology

## 2021-11-26 ENCOUNTER — Other Ambulatory Visit: Payer: Self-pay | Admitting: Specialist

## 2021-11-26 ENCOUNTER — Institutional Professional Consult (permissible substitution): Payer: Self-pay | Admitting: Neurology

## 2021-11-26 ENCOUNTER — Encounter: Payer: Self-pay | Admitting: Neurology

## 2021-11-26 DIAGNOSIS — J849 Interstitial pulmonary disease, unspecified: Secondary | ICD-10-CM

## 2021-12-10 ENCOUNTER — Emergency Department
Admission: EM | Admit: 2021-12-10 | Discharge: 2021-12-10 | Discharge status: Against Medical Advice | Payer: Medicare Other

## 2021-12-10 ENCOUNTER — Ambulatory Visit: Admission: RE | Admit: 2021-12-10 | Payer: Medicare Other | Source: Ambulatory Visit

## 2021-12-11 ENCOUNTER — Ambulatory Visit: Payer: Medicare Other | Admitting: Urology

## 2021-12-18 ENCOUNTER — Ambulatory Visit: Payer: Medicare Other | Admitting: Psychiatry

## 2021-12-23 ENCOUNTER — Institutional Professional Consult (permissible substitution): Payer: Self-pay | Admitting: Neurology

## 2021-12-24 ENCOUNTER — Ambulatory Visit: Admission: RE | Admit: 2021-12-24 | Payer: Medicare Other | Source: Ambulatory Visit

## 2021-12-24 ENCOUNTER — Other Ambulatory Visit: Payer: Self-pay | Admitting: Specialist

## 2021-12-24 DIAGNOSIS — J849 Interstitial pulmonary disease, unspecified: Secondary | ICD-10-CM

## 2021-12-24 DIAGNOSIS — J449 Chronic obstructive pulmonary disease, unspecified: Secondary | ICD-10-CM

## 2022-01-01 ENCOUNTER — Encounter: Payer: Self-pay | Admitting: Urology

## 2022-01-05 ENCOUNTER — Ambulatory Visit: Payer: Medicare Other | Admitting: Psychiatry

## 2022-01-12 NOTE — Progress Notes (Unsigned)
01/13/2022 4:45 PM   Jared Farley 1960/05/07 [id number]  Referring provider: Coral Spikes, DO [place],  [place] [address]  Urological history: 1. Testosterone deficiency -contributing factors of age and chronic opioid use -testosterone, 08/2021 - 240 -hemoglobin/hematocrit, 08/2021 - 13.1/37.2 -hepatic panel, 08/2021 - WNL  2. ED -contributing factors of age, COPD, smoking, testosterone deficiency, BPH -SHIM 5 -sildenafil 100 mg, on-demand-dosing  3. BPH with LU TS -PSA, 08/2021 - 0.6 -I PSS 24/4 -alfuzosin 10 mg daily  4. Chronic prostatitis  5. High risk hematuria -smoker > 30 pack year history -remote history of micro heme -has not had CTU or cysto -UA 11-30 RBC's   No chief complaint on file.     HPI: Jared Farley is a 61 y.o. male for 6 month follow up.    He has noticed a worsening of his urinary symptoms (nocturia, frequency, urgency and feelings of incomplete bladder emptying).  Patient denies any modifying or aggravating factors.  Patient denies any gross hematuria, dysuria or suprapubic/flank pain.  Patient denies any fevers, chills, nausea or vomiting.    UA 11-30 RBC's.      Score:  1-7 Mild 8-19 Moderate 20-35 Severe    Patient is not having spontaneous erections.  He denies any pain or curvature with erections.  He has failed PDE5i's.         Score: 1-7 Severe ED 8-11 Moderate ED 12-16 Mild-Moderate ED 17-21 Mild ED 22-25 No ED    PMH: Past Medical History:  Diagnosis Date   Chronic pain following surgery or procedure    testicular   COPD (chronic obstructive pulmonary disease) ([facility])    Degenerative joint disease    Seizures ([place])     Surgical History: Past Surgical History:  Procedure Laterality Date   ANKLE SURGERY     NASAL FRACTURE SURGERY     VASECTOMY      Home Medications:  Allergies as of 01/13/2022       Reactions   Trazodone And Nefazodone    Trazodone Hcl    Pt can't  recall reaction        Medication List        Accurate as of January 12, 2022  4:45 PM. If you have any questions, ask your nurse or doctor.          albuterol 108 (90 Base) MCG/ACT inhaler Commonly known as: VENTOLIN HFA Inhale into the lungs.   alfuzosin 10 MG 24 hr tablet Commonly known as: UROXATRAL Take 1 tablet (10 mg total) by mouth daily with breakfast.   aspirin 81 MG tablet Take 81 mg by mouth daily.   clomiPHENE 50 MG tablet Commonly known as: CLOMID Take 1/2 tablet daily   ipratropium-albuterol 0.5-2.5 (3) MG/3ML Soln Commonly known as: DUONEB SMARTSIG:3 Milliliter(s) Via Nebulizer Every 6 Hours   lamoTRIgine 25 MG tablet Commonly known as: LaMICtal Take 1 tablet (25 mg total) by mouth daily for 14 days, THEN 2 tablets (50 mg total) daily. Start taking on: October 28, 2021   lidocaine 5 % Commonly known as: LIDODERM 1 patch daily.   morphine 60 MG 12 hr tablet Commonly known as: MS CONTIN Take 60 mg by mouth 3 (three) times daily.   oxyCODONE 15 MG immediate release tablet Commonly known as: ROXICODONE oxycodone 15 mg tablet   predniSONE 10 MG tablet Commonly known as: DELTASONE Take by mouth.   sildenafil 100 MG tablet Commonly known as: VIAGRA TAKE  1 TABLET BY MOUTH ONCE DAILY AS NEEDED FOR ERECTILE DYSFUNCTION   Testosterone 20.25 MG/1.25GM (1.62%) Gel Place 2 Pump onto the skin daily. Apply AndroGel 1.62% only to your shoulders and upper arms that will be covered by a short-sleeve t-shirt.   triamcinolone cream 0.1 % Commonly known as: KENALOG Apply 1 application topically 2 (two) times daily.   VITAMIN D-3 PO Take by mouth.        Allergies:  Allergies  Allergen Reactions   Trazodone And Nefazodone    Trazodone Hcl     Pt can't recall reaction    Family History: Family History  Problem Relation Age of Onset   Depression Mother    Depression Brother    Suicidality Brother     Social History:  reports that he has  been smoking cigarettes. He has been smoking an average of 1 pack per day. He has never used smokeless tobacco. He reports current alcohol use. He reports current drug use. Drug: Marijuana.  ROS: Pertinent ROS in HPI  Physical Exam: Blood pressure 105/66, pulse 92, height '5\' 11"'$  (1.803 m), weight 180 lb (81.6 kg).  Constitutional:  Well nourished. Alert and oriented, No acute distress. HEENT: [place] AT, moist mucus membranes.  Trachea midline Cardiovascular: No clubbing, cyanosis, or edema. Respiratory: Normal respiratory effort, no increased work of breathing. Neurologic: Grossly intact, no focal deficits, moving all 4 extremities. Psychiatric: Normal mood and affect.    Laboratory Data:  Prostate Specific Ag, Serum  Latest Ref Rng 0.0 - 4.0 ng/mL  07/05/2020 0.3   07/08/2021 0.4   09/22/2021 0.6    Component     Latest Ref Rng 09/22/2021  Hemoglobin     13.0 - 17.7 g/dL 13.1   HCT     37.5 - 51.0 % 37.2 (L)     Legend: (L) Low  Component     Latest Ref Rng 09/22/2021  Testosterone     264 - 916 ng/dL 240 (L)     Legend: (L) Low  Component     Latest Ref Rng 09/22/2021  Total Protein     6.0 - 8.5 g/dL 6.9   Albumin     3.8 - 4.9 g/dL 4.1   AST     0 - 40 IU/L 20   ALT     0 - 44 IU/L 12   Alkaline Phosphatase     44 - 121 IU/L 66   Total Bilirubin     0.0 - 1.2 mg/dL <0.2   BILIRUBIN, DIRECT     0.00 - 0.40 mg/dL <0.10   I have reviewed the labs.   Pertinent Imaging: N/A  Assessment & Plan:    1. Testosterone deficiency  -testosterone levels are subtherapeutic -H & H anemic -restart Clomid 50 mg, 1/2 tablet daily   2. BPH with LUTS -PSA stable -UA with micro heme -most bothersome symptoms are frequency, incontinence and weak stream -continue conservative management, avoiding bladder irritants and timed voiding's -Continue alfuzosin 10 mg daily  3. Erectile dysfunction:    - failed PDE5i's  - not interested in further treatment  4. Microscopic  hematuria - I explained to the patient that there are a number of causes that can be associated with blood in the urine, such as stones, BPH, UTI's, damage to the urinary tract and/or cancer. -at this time, they are in a high risk stratification for hematuria per AUA guidelines due to their age (>60 years , > 30 pack year smoking history  and  > 25 RBC's -recommended studies for high risk are CT urogram and cysto - I explained to the patient that a contrast material will be injected into a vein and that in rare instances, an allergic reaction can result and may even life threatening (1:100,000)  The patient denies any allergies to contrast, iodine and/or seafood and is not taking metformin. - Following the imaging study,  I've recommended a cystoscopy. I described how this is performed, typically in an office setting with a flexible cystoscope. We described the risks, benefits, and possible side effects, the most common of which is a minor amount of blood in the urine and/or burning which usually resolves in 24 to 48 hours.   - The patient had the opportunity to ask questions which were answered. Based upon this discussion, the patient is willing to proceed. Therefore, I've ordered: a CT Urogram and cystoscopy. - The patient will return following all of the above for discussion of the results.  - UA - Urine culture -he states that due to his PTSD, he will need to be heavily sedated for the cysto     These notes generated with voice recognition software. I apologize for typographical errors.  [name], PA-C  [facility] [address]  [place], [place] [address] [phone]

## 2022-01-13 ENCOUNTER — Encounter: Payer: Self-pay | Admitting: Urology

## 2022-01-13 ENCOUNTER — Ambulatory Visit: Payer: Medicare Other | Admitting: Urology

## 2022-01-13 VITALS — BP 132/72 | HR 92 | Ht 70.0 in | Wt 183.0 lb

## 2022-01-13 DIAGNOSIS — R3129 Other microscopic hematuria: Secondary | ICD-10-CM

## 2022-01-13 DIAGNOSIS — N401 Enlarged prostate with lower urinary tract symptoms: Secondary | ICD-10-CM | POA: Diagnosis not present

## 2022-01-13 DIAGNOSIS — N138 Other obstructive and reflux uropathy: Secondary | ICD-10-CM

## 2022-01-13 MED ORDER — ALFUZOSIN HCL ER 10 MG PO TB24: 10.0000 mg | ORAL_TABLET | Freq: Every day | ORAL | 11 refills | Status: DC

## 2022-01-27 ENCOUNTER — Ambulatory Visit
Admission: RE | Admit: 2022-01-27 | Discharge: 2022-01-27 | Disposition: A | Discharge status: Home / Self Care | Payer: Medicare Other | Source: Ambulatory Visit | Attending: Urology | Admitting: Urology

## 2022-01-27 ENCOUNTER — Ambulatory Visit: Admission: RE | Admit: 2022-01-27 | Payer: Medicare Other | Source: Ambulatory Visit

## 2022-01-27 ENCOUNTER — Other Ambulatory Visit: Payer: Self-pay | Admitting: Specialist

## 2022-01-27 DIAGNOSIS — J42 Unspecified chronic bronchitis: Secondary | ICD-10-CM

## 2022-01-27 DIAGNOSIS — J8410 Pulmonary fibrosis, unspecified: Secondary | ICD-10-CM | POA: Insufficient documentation

## 2022-01-27 DIAGNOSIS — I2510 Atherosclerotic heart disease of native coronary artery without angina pectoris: Secondary | ICD-10-CM | POA: Insufficient documentation

## 2022-01-27 DIAGNOSIS — R3129 Other microscopic hematuria: Secondary | ICD-10-CM | POA: Diagnosis not present

## 2022-01-27 DIAGNOSIS — I700 Atherosclerosis of aorta: Secondary | ICD-10-CM | POA: Diagnosis not present

## 2022-01-27 LAB — POCT I-STAT CREATININE: Creatinine, Ser: 0.9 mg/dL (ref 0.61–1.24)

## 2022-01-27 MED ORDER — IOHEXOL 300 MG/ML  SOLN
100.0000 mL | Freq: Once | INTRAMUSCULAR | Status: AC | PRN
  Administered 2022-01-27: 100 mL via INTRAVENOUS

## 2022-01-28 ENCOUNTER — Telehealth: Payer: Self-pay

## 2022-01-28 NOTE — Telephone Encounter (Signed)
-----   Message from Nori Riis, PA-C sent at 01/28/2022 12:13 PM EST ----- He needs an appointment to discuss CT results and recheck his urine to see if we still need to have the cysto.

## 2022-01-28 NOTE — Telephone Encounter (Signed)
Pt booked for results appt, pt confirmed.

## 2022-01-29 ENCOUNTER — Ambulatory Visit (INDEPENDENT_AMBULATORY_CARE_PROVIDER_SITE_OTHER): Payer: Medicare Other | Admitting: Urology

## 2022-01-29 ENCOUNTER — Encounter: Payer: Self-pay | Admitting: Urology

## 2022-01-29 VITALS — BP 121/78 | HR 102 | Ht 72.0 in | Wt 180.0 lb

## 2022-01-29 DIAGNOSIS — R3129 Other microscopic hematuria: Secondary | ICD-10-CM

## 2022-01-29 LAB — URINALYSIS, COMPLETE
Bilirubin, UA: NEGATIVE
Glucose, UA: NEGATIVE
Leukocytes,UA: NEGATIVE
Nitrite, UA: NEGATIVE
Specific Gravity, UA: 1.025 (ref 1.005–1.030)
Urobilinogen, Ur: 0.2 mg/dL (ref 0.2–1.0)
pH, UA: 5 (ref 5.0–7.5)

## 2022-01-29 LAB — MICROSCOPIC EXAMINATION

## 2022-01-29 NOTE — H&P (View-Only) (Signed)
01/29/2022 3:30 PM   Jared Farley Nov 18, 1960 [id number]  Referring provider: Gladstone Lighter, MD [place],  [place] [address]  Urological history: 1. Testosterone deficiency -contributing factors of age and chronic opioid use -testosterone, 08/2021 - 240 -hemoglobin/hematocrit, 08/2021 - 13.1/37.2 -hepatic panel, 08/2021 - WNL  2. ED -contributing factors of age, COPD, smoking, testosterone deficiency, BPH -sildenafil 100 mg, on-demand-dosing  3. BPH with LU TS -PSA, 09/2021 - 0.6 -alfuzosin 10 mg daily  4. Chronic prostatitis  5. High risk hematuria -smoker > 30 pack year history -history of micro heme -no reports of gross heme -UA 3-10 RBC's  Chief Complaint  Patient presents with   Hematuria   Follow-up      HPI: Jared Farley is a 61 y.o. male for further discussion regarding his CT scan results.   CT urogram (12/2021) -mild diffuse bladder wall thickening  He continues to have issues with chronic scrotal pain managed with opioids through a pain clinic.  Patient denies any modifying or aggravating factors.  Patient denies any gross hematuria, dysuria or suprapubic/flank pain.  Patient denies any fevers, chills, nausea or vomiting.    Urinalysis yellow cloudy, trace ketones, 1.025, trace blood, pH 5.0, protein trace, 0-5 WBCs, 3-10 RBCs, 0-10 epithelial cells, hyaline and granular casts present, mucus present with moderate bacteria.  He emphasized to me that due to his past experience with healthcare providers in regards to his scrotal pain, he suffers from PTSD and will not tolerate an in office cystoscope.  PMH: Past Medical History:  Diagnosis Date   Chronic pain following surgery or procedure    testicular   COPD (chronic obstructive pulmonary disease) (HCC)    Degenerative joint disease    Seizures ([place])     Surgical History: Past Surgical History:  Procedure Laterality Date   ANKLE SURGERY     NASAL FRACTURE SURGERY      VASECTOMY      Home Medications:  Allergies as of 01/29/2022       Reactions   Trazodone And Nefazodone    Trazodone Hcl    Pt can't recall reaction        Medication List        Accurate as of January 29, 2022  3:30 PM. If you have any questions, ask your nurse or doctor.          STOP taking these medications    clomiPHENE 50 MG tablet Commonly known as: CLOMID Stopped by: Tonny Isensee, PA-C   lamoTRIgine 25 MG tablet Commonly known as: LaMICtal Stopped by: [name], PA-C   sildenafil 100 MG tablet Commonly known as: VIAGRA Stopped by: Travares Nelles, PA-C       TAKE these medications    albuterol 108 (90 Base) MCG/ACT inhaler Commonly known as: VENTOLIN HFA Inhale into the lungs.   alfuzosin 10 MG 24 hr tablet Commonly known as: UROXATRAL Take 1 tablet (10 mg total) by mouth daily with breakfast.   aspirin 81 MG tablet Take 81 mg by mouth daily.   ipratropium-albuterol 0.5-2.5 (3) MG/3ML Soln Commonly known as: DUONEB SMARTSIG:3 Milliliter(s) Via Nebulizer Every 6 Hours   lidocaine 5 % Commonly known as: LIDODERM 1 patch daily.   morphine 60 MG 12 hr tablet Commonly known as: MS CONTIN Take 60 mg by mouth 3 (three) times daily.   oxyCODONE 15 MG immediate release tablet Commonly known as: ROXICODONE oxycodone 15 mg tablet   predniSONE 10 MG tablet Commonly known  as: DELTASONE Take by mouth.   Testosterone 20.25 MG/1.25GM (1.62%) Gel Place 2 Pump onto the skin daily. Apply AndroGel 1.62% only to your shoulders and upper arms that will be covered by a short-sleeve t-shirt.   triamcinolone cream 0.1 % Commonly known as: KENALOG Apply 1 application topically 2 (two) times daily.   VITAMIN D-3 PO Take by mouth.        Allergies:  Allergies  Allergen Reactions   Trazodone And Nefazodone    Trazodone Hcl     Pt can't recall reaction    Family History: Family History  Problem Relation Age of Onset    Depression Mother    Depression Brother    Suicidality Brother     Social History:  reports that he has been smoking cigarettes. He has been smoking an average of 1 pack per day. He has never used smokeless tobacco. He reports current alcohol use. He reports current drug use. Drug: Marijuana.  ROS: Pertinent ROS in HPI  Physical Exam: Blood pressure 121/78, pulse (!) 102, height 6' (1.829 m), weight 180 lb (81.6 kg).  Constitutional:  Well nourished. Alert and oriented, No acute distress. HEENT: [place] AT, moist mucus membranes.  Trachea midline Cardiovascular: No clubbing, cyanosis, or edema. Respiratory: Normal respiratory effort, no increased work of breathing. Neurologic: Grossly intact, no focal deficits, moving all 4 extremities. Psychiatric: Normal mood and affect.   Laboratory Data: Results for orders placed or performed in visit on 01/29/22  Mycoplasma / Ureaplasma Culture   Specimen: Genital   UR  Result Value Ref Range   Ureaplasma urealyticum Comment Negative   Mycoplasma hominis Culture Comment Negative  Microscopic Examination   Urine  Result Value Ref Range   WBC, UA 0-5 0 - 5 /hpf   RBC, Urine 3-10 (A) 0 - 2 /hpf   Epithelial Cells (non renal) 0-10 0 - 10 /hpf   Casts Present (A) None seen /lpf   Cast Type Hyaline casts N/A   Mucus, UA Present (A) Not Estab.   Bacteria, UA Moderate (A) None seen/Few  Urinalysis, Complete  Result Value Ref Range   Specific Gravity, UA 1.025 1.005 - 1.030   pH, UA 5.0 5.0 - 7.5   Color, UA Yellow Yellow   Appearance Ur Hazy (A) Clear   Leukocytes,UA Negative Negative   Protein,UA Trace (A) Negative/Trace   Glucose, UA Negative Negative   Ketones, UA Trace (A) Negative   RBC, UA Trace (A) Negative   Bilirubin, UA Negative Negative   Urobilinogen, Ur 0.2 0.2 - 1.0 mg/dL   Nitrite, UA Negative Negative   Microscopic Examination See below:   I have reviewed the labs.    Pertinent Imaging: Narrative & Impression  CLINICAL  DATA:  Microscopic hematuria.   EXAM: CT ABDOMEN AND PELVIS WITHOUT AND WITH CONTRAST   TECHNIQUE: Multidetector CT imaging of the abdomen and pelvis was performed following the standard protocol before and following the bolus administration of intravenous contrast.   RADIATION DOSE REDUCTION: This exam was performed according to the departmental dose-optimization program which includes automated exposure control, adjustment of the mA and/or kV according to patient size and/or use of iterative reconstruction technique.   CONTRAST:  151m OMNIPAQUE IOHEXOL 300 MG/ML  SOLN   COMPARISON:  None Available.   FINDINGS: Lower Chest: Subpleural honeycombing in both lung bases, consistent with interstitial fibrosis.   Hepatobiliary: A few tiny sub-cm cysts are noted in the left hepatic lobe. No hepatic masses identified. Gallbladder is unremarkable.  No evidence of biliary ductal dilatation.   Pancreas:  No mass or inflammatory changes.   Spleen: Within normal limits in size and appearance.   Adrenals/Urinary Tract: No adrenal masses identified. No evidence of urolithiasis or hydronephrosis. No suspicious renal masses identified. No masses seen involving the collecting systems, ureters, or bladder. Mild diffuse bladder wall thickening is seen which may be due to cystitis or chronic bladder outlet obstruction.   Stomach/Bowel: No evidence of obstruction, inflammatory process or abnormal fluid collections. Normal appendix visualized.   Vascular/Lymphatic: No pathologically enlarged lymph nodes. No acute vascular findings. Aortic atherosclerotic calcification incidentally noted.   Reproductive: Normal size prostate gland. No significant abnormality identified.   Other:  None.   Musculoskeletal:  No suspicious bone lesions identified.   IMPRESSION: No radiographic evidence of urinary tract neoplasm, urolithiasis, or hydronephrosis.   Mild diffuse bladder wall thickening,  which may be due to cystitis or chronic bladder outlet obstruction.   Bibasilar pulmonary interstitial fibrosis incidentally noted.   Aortic Atherosclerosis (ICD10-I70.0).     Electronically Signed   By: Marlaine Hind M.D.   On: 01/28/2022 10:38  I have independently reviewed the films.  See HPI.    Assessment & Plan:     1. Microscopic hematuria -CT urogram did not identify any source of the microscopic hematuria -Discussed with the patient that he continues to have persistent microscopic hematuria on urinalysis, but we will send the urine for culture and atypicals and if positive treat and reassess -If cultures are negative, we will schedule his cystoscopy in the OR so that he may be heavily sedated  These notes generated with voice recognition software. I apologize for typographical errors.  [place], [place] [address]  [place], [place] [address] [phone]

## 2022-01-29 NOTE — Progress Notes (Signed)
01/29/2022 3:30 PM   Jared Farley 11-06-1960 [id number]  Referring provider: Gladstone Lighter, MD [place],  [place] [address]  Urological history: 1. Testosterone deficiency -contributing factors of age and chronic opioid use -testosterone, 08/2021 - 240 -hemoglobin/hematocrit, 08/2021 - 13.1/37.2 -hepatic panel, 08/2021 - WNL  2. ED -contributing factors of age, COPD, smoking, testosterone deficiency, BPH -sildenafil 100 mg, on-demand-dosing  3. BPH with LU TS -PSA, 09/2021 - 0.6 -alfuzosin 10 mg daily  4. Chronic prostatitis  5. High risk hematuria -smoker > 30 pack year history -history of micro heme -no reports of gross heme -UA 3-10 RBC's  Chief Complaint  Patient presents with   Hematuria   Follow-up      HPI: Jared Farley is a 61 y.o. male for further discussion regarding his CT scan results.   CT urogram (12/2021) -mild diffuse bladder wall thickening  He continues to have issues with chronic scrotal pain managed with opioids through a pain clinic.  Patient denies any modifying or aggravating factors.  Patient denies any gross hematuria, dysuria or suprapubic/flank pain.  Patient denies any fevers, chills, nausea or vomiting.    Urinalysis yellow cloudy, trace ketones, 1.025, trace blood, pH 5.0, protein trace, 0-5 WBCs, 3-10 RBCs, 0-10 epithelial cells, hyaline and granular casts present, mucus present with moderate bacteria.  He emphasized to me that due to his past experience with healthcare providers in regards to his scrotal pain, he suffers from PTSD and will not tolerate an in office cystoscope.  PMH: Past Medical History:  Diagnosis Date   Chronic pain following surgery or procedure    testicular   COPD (chronic obstructive pulmonary disease) (HCC)    Degenerative joint disease    Seizures ([place])     Surgical History: Past Surgical History:  Procedure Laterality Date   ANKLE SURGERY     NASAL FRACTURE SURGERY      VASECTOMY      Home Medications:  Allergies as of 01/29/2022       Reactions   Trazodone And Nefazodone    Trazodone Hcl    Pt can't recall reaction        Medication List        Accurate as of January 29, 2022  3:30 PM. If you have any questions, ask your nurse or doctor.          STOP taking these medications    clomiPHENE 50 MG tablet Commonly known as: CLOMID Stopped by: Kaci Freel, PA-C   lamoTRIgine 25 MG tablet Commonly known as: LaMICtal Stopped by: [name], PA-C   sildenafil 100 MG tablet Commonly known as: VIAGRA Stopped by: Adaria Hole, PA-C       TAKE these medications    albuterol 108 (90 Base) MCG/ACT inhaler Commonly known as: VENTOLIN HFA Inhale into the lungs.   alfuzosin 10 MG 24 hr tablet Commonly known as: UROXATRAL Take 1 tablet (10 mg total) by mouth daily with breakfast.   aspirin 81 MG tablet Take 81 mg by mouth daily.   ipratropium-albuterol 0.5-2.5 (3) MG/3ML Soln Commonly known as: DUONEB SMARTSIG:3 Milliliter(s) Via Nebulizer Every 6 Hours   lidocaine 5 % Commonly known as: LIDODERM 1 patch daily.   morphine 60 MG 12 hr tablet Commonly known as: MS CONTIN Take 60 mg by mouth 3 (three) times daily.   oxyCODONE 15 MG immediate release tablet Commonly known as: ROXICODONE oxycodone 15 mg tablet   predniSONE 10 MG tablet Commonly known  as: DELTASONE Take by mouth.   Testosterone 20.25 MG/1.25GM (1.62%) Gel Place 2 Pump onto the skin daily. Apply AndroGel 1.62% only to your shoulders and upper arms that will be covered by a short-sleeve t-shirt.   triamcinolone cream 0.1 % Commonly known as: KENALOG Apply 1 application topically 2 (two) times daily.   VITAMIN D-3 PO Take by mouth.        Allergies:  Allergies  Allergen Reactions   Trazodone And Nefazodone    Trazodone Hcl     Pt can't recall reaction    Family History: Family History  Problem Relation Age of Onset    Depression Mother    Depression Brother    Suicidality Brother     Social History:  reports that he has been smoking cigarettes. He has been smoking an average of 1 pack per day. He has never used smokeless tobacco. He reports current alcohol use. He reports current drug use. Drug: Marijuana.  ROS: Pertinent ROS in HPI  Physical Exam: Blood pressure 121/78, pulse (!) 102, height 6' (1.829 m), weight 180 lb (81.6 kg).  Constitutional:  Well nourished. Alert and oriented, No acute distress. HEENT: [place] AT, moist mucus membranes.  Trachea midline Cardiovascular: No clubbing, cyanosis, or edema. Respiratory: Normal respiratory effort, no increased work of breathing. Neurologic: Grossly intact, no focal deficits, moving all 4 extremities. Psychiatric: Normal mood and affect.   Laboratory Data: Results for orders placed or performed in visit on 01/29/22  Mycoplasma / Ureaplasma Culture   Specimen: Genital   UR  Result Value Ref Range   Ureaplasma urealyticum Comment Negative   Mycoplasma hominis Culture Comment Negative  Microscopic Examination   Urine  Result Value Ref Range   WBC, UA 0-5 0 - 5 /hpf   RBC, Urine 3-10 (A) 0 - 2 /hpf   Epithelial Cells (non renal) 0-10 0 - 10 /hpf   Casts Present (A) None seen /lpf   Cast Type Hyaline casts N/A   Mucus, UA Present (A) Not Estab.   Bacteria, UA Moderate (A) None seen/Few  Urinalysis, Complete  Result Value Ref Range   Specific Gravity, UA 1.025 1.005 - 1.030   pH, UA 5.0 5.0 - 7.5   Color, UA Yellow Yellow   Appearance Ur Hazy (A) Clear   Leukocytes,UA Negative Negative   Protein,UA Trace (A) Negative/Trace   Glucose, UA Negative Negative   Ketones, UA Trace (A) Negative   RBC, UA Trace (A) Negative   Bilirubin, UA Negative Negative   Urobilinogen, Ur 0.2 0.2 - 1.0 mg/dL   Nitrite, UA Negative Negative   Microscopic Examination See below:   I have reviewed the labs.    Pertinent Imaging: Narrative & Impression  CLINICAL  DATA:  Microscopic hematuria.   EXAM: CT ABDOMEN AND PELVIS WITHOUT AND WITH CONTRAST   TECHNIQUE: Multidetector CT imaging of the abdomen and pelvis was performed following the standard protocol before and following the bolus administration of intravenous contrast.   RADIATION DOSE REDUCTION: This exam was performed according to the departmental dose-optimization program which includes automated exposure control, adjustment of the mA and/or kV according to patient size and/or use of iterative reconstruction technique.   CONTRAST:  140m OMNIPAQUE IOHEXOL 300 MG/ML  SOLN   COMPARISON:  None Available.   FINDINGS: Lower Chest: Subpleural honeycombing in both lung bases, consistent with interstitial fibrosis.   Hepatobiliary: A few tiny sub-cm cysts are noted in the left hepatic lobe. No hepatic masses identified. Gallbladder is unremarkable.  No evidence of biliary ductal dilatation.   Pancreas:  No mass or inflammatory changes.   Spleen: Within normal limits in size and appearance.   Adrenals/Urinary Tract: No adrenal masses identified. No evidence of urolithiasis or hydronephrosis. No suspicious renal masses identified. No masses seen involving the collecting systems, ureters, or bladder. Mild diffuse bladder wall thickening is seen which may be due to cystitis or chronic bladder outlet obstruction.   Stomach/Bowel: No evidence of obstruction, inflammatory process or abnormal fluid collections. Normal appendix visualized.   Vascular/Lymphatic: No pathologically enlarged lymph nodes. No acute vascular findings. Aortic atherosclerotic calcification incidentally noted.   Reproductive: Normal size prostate gland. No significant abnormality identified.   Other:  None.   Musculoskeletal:  No suspicious bone lesions identified.   IMPRESSION: No radiographic evidence of urinary tract neoplasm, urolithiasis, or hydronephrosis.   Mild diffuse bladder wall thickening,  which may be due to cystitis or chronic bladder outlet obstruction.   Bibasilar pulmonary interstitial fibrosis incidentally noted.   Aortic Atherosclerosis (ICD10-I70.0).     Electronically Signed   By: Marlaine Hind M.D.   On: 01/28/2022 10:38  I have independently reviewed the films.  See HPI.    Assessment & Plan:     1. Microscopic hematuria -CT urogram did not identify any source of the microscopic hematuria -Discussed with the patient that he continues to have persistent microscopic hematuria on urinalysis, but we will send the urine for culture and atypicals and if positive treat and reassess -If cultures are negative, we will schedule his cystoscopy in the OR so that he may be heavily sedated  These notes generated with voice recognition software. I apologize for typographical errors.  [place], [place] [address]  [place], [place] [address] [phone]

## 2022-02-02 LAB — GC/CHLAMYDIA PROBE AMP
Chlamydia trachomatis, NAA: NEGATIVE
Neisseria Gonorrhoeae by PCR: NEGATIVE

## 2022-02-03 LAB — CULTURE, URINE COMPREHENSIVE

## 2022-02-05 LAB — MYCOPLASMA / UREAPLASMA CULTURE
Mycoplasma hominis Culture: NEGATIVE
Ureaplasma urealyticum: NEGATIVE

## 2022-02-06 ENCOUNTER — Telehealth: Payer: Self-pay

## 2022-02-06 NOTE — Telephone Encounter (Signed)
Ok per DPR, [facility] notifying pt of msg.

## 2022-02-06 NOTE — Telephone Encounter (Signed)
-----   Message from Nori Riis, PA-C sent at 02/06/2022  8:25 AM EST ----- Please let Jared Farley know that his urine cultures have returned negative for infection, so we will need to proceed with the cystoscopy in the OR.

## 2022-02-08 ENCOUNTER — Other Ambulatory Visit: Payer: Self-pay | Admitting: Urology

## 2022-02-08 DIAGNOSIS — R3129 Other microscopic hematuria: Secondary | ICD-10-CM

## 2022-02-08 NOTE — Progress Notes (Unsigned)
Surgical Physician Order Form [facility] [place]  * Scheduling expectation : Next Available with Dr. Diamantina Providence   *Length of Case:   *Clearance needed: no  *Anticoagulation Instructions: Hold all anticoagulants  *Aspirin Instructions: Hold Aspirin  *Post-op visit Date/Instructions:   TBD  *Diagnosis:  Microscopic hematuria with a  45 pack year history of smoking  *Procedure: Cystoscopy w/possible bladder biopsy    Additional orders: N/A  -Admit type: OUTpatient  -Anesthesia: MAC  -VTE Prophylaxis Standing Order SCD's       Other:   -Standing Lab Orders Per Anesthesia    Lab other: UA&Urine Culture  -Standing Test orders EKG/Chest x-ray per Anesthesia       Test other:   - Medications:  Ancef 2gm IV  -Other orders:  N/A

## 2022-02-11 ENCOUNTER — Telehealth: Payer: Self-pay

## 2022-02-11 NOTE — Progress Notes (Signed)
   [facility]- Surgical Posting From  Surgery Date: Date: 02/27/2022  Surgeon: Dr. Nickolas Madrid, MD  Inpt ( No  )   Outpt (Yes)   Obs ( No  )   Diagnosis: R31.29 Microscopic Hematuria  -CPT: 52000, 08811   Surgery: Cystoscopy with possible bladder biopsy   Stop Anticoagulations: Yes and hold ASA  Cardiac/Medical/Pulmonary Clearance needed: no  *Orders entered into EPIC  Date: 02/11/22   *Case booked in EPIC  Date: 02/11/22  *Notified pt of Surgery: Date: 02/10/2022  PRE-OP UA & CX: yes, will obtain in clinic on 02/18/2022  *Placed into Prior Authorization Work Fabio Bering Date: 02/11/22  Assistant/laser/rep:No

## 2022-02-11 NOTE — Telephone Encounter (Signed)
I spoke with Jared Farley. We have discussed possible surgery dates and Friday December 29th, 2023 was agreed upon by all parties. Patient given information about surgery date, what to expect pre-operatively and post operatively.  We discussed that a Pre-Admission Testing office will be calling to set up the pre-op visit that will take place prior to surgery, and that these appointments are typically done over the phone with a Pre-Admissions RN.  Informed patient that our office will communicate any additional care to be provided after surgery. Patients questions or concerns were discussed during our call. Advised to call our office should there be any additional information, questions or concerns that arise. Patient verbalized understanding.

## 2022-02-18 ENCOUNTER — Other Ambulatory Visit: Payer: Medicare Other

## 2022-02-18 DIAGNOSIS — R3129 Other microscopic hematuria: Secondary | ICD-10-CM

## 2022-02-19 LAB — URINALYSIS, COMPLETE
Bilirubin, UA: NEGATIVE
Glucose, UA: NEGATIVE
Ketones, UA: NEGATIVE
Leukocytes,UA: NEGATIVE
Nitrite, UA: NEGATIVE
Protein,UA: NEGATIVE
Specific Gravity, UA: 1.025 (ref 1.005–1.030)
Urobilinogen, Ur: 0.2 mg/dL (ref 0.2–1.0)
pH, UA: 5 (ref 5.0–7.5)

## 2022-02-19 LAB — MICROSCOPIC EXAMINATION

## 2022-02-20 ENCOUNTER — Inpatient Hospital Stay: Admission: RE | Admit: 2022-02-20 | Payer: Medicare Other | Source: Ambulatory Visit

## 2022-02-20 LAB — CULTURE, URINE COMPREHENSIVE

## 2022-02-20 NOTE — Pre-Procedure Instructions (Signed)
Unable to reach patient by phone x 2 today. Generic VM left. Will reschedule phone interview for next week.

## 2022-02-24 ENCOUNTER — Inpatient Hospital Stay
Admission: RE | Admit: 2022-02-24 | Discharge: 2022-02-24 | Disposition: A | Discharge status: Home / Self Care | Payer: Medicare Other | Source: Ambulatory Visit

## 2022-02-24 NOTE — Pre-Procedure Instructions (Signed)
Attempted x 2 to reach patient for his scheduled PAT appointment, message was left for a return call, called alternate contact Ellis,Mike and spoke with him, Mr. Jared Farley voiced that he will give patient a message to return our call.

## 2022-02-24 NOTE — Patient Instructions (Signed)
Your procedure is scheduled on: 02/27/22 - Friday Report to the Registration Desk on the 1st floor of the [facility]. To find out your arrival time, please call [phone] between 1PM - 3PM on: 02/26/22 - Thursday If your arrival time is 6:00 am, do not arrive prior to that time as the [facility] entrance doors do not open until 6:00 am.  REMEMBER: Instructions that are not followed completely may result in serious medical risk, up to and including death; or upon the discretion of your surgeon and anesthesiologist your surgery may need to be rescheduled.  Do not eat food or drink any liquids  after midnight the night before surgery.  No gum chewing, lozengers or hard candies.  TAKE THESE MEDICATIONS THE MORNING OF SURGERY WITH A SIP OF WATER: - Use albuterol (VENTOLIN HFA) inhaler and bring to the hospital. - alfuzosin (UROXATRAL)  - morphine (MS CONTIN)  - predniSONE (DELTASONE)     One week prior to surgery: Stop Anti-inflammatories (NSAIDS) such as Advil, Aleve, Ibuprofen, Motrin, Naproxen, Naprosyn and Aspirin based products such as Excedrin, Goodys Powder, BC Powder.  Stop ANY OVER THE COUNTER supplements until after surgery.  You may however, continue to take Tylenol if needed for pain up until the day of surgery.  No Alcohol for 24 hours before or after surgery.  No Smoking including e-cigarettes for 24 hours prior to surgery.  No chewable tobacco products for at least 6 hours prior to surgery.  No nicotine patches on the day of surgery.  Do not use any "recreational" drugs for at least a week prior to your surgery.  Please be advised that the combination of cocaine and anesthesia may have negative outcomes, up to and including death. If you test positive for cocaine, your surgery will be cancelled.  On the morning of surgery brush your teeth with toothpaste and water, you may rinse your mouth with mouthwash if you wish. Do not swallow any toothpaste or  mouthwash.  Do not wear jewelry, make-up, hairpins, clips or nail polish.  Do not wear lotions, powders, or perfumes.   Do not shave body from the neck down 48 hours prior to surgery just in case you cut yourself which could leave a site for infection.  Also, freshly shaved skin may become irritated if using the CHG soap.  Contact lenses, hearing aids and dentures may not be worn into surgery.  Do not bring valuables to the hospital. [facility] is not responsible for any missing/lost belongings or valuables.   Notify your doctor if there is any change in your medical condition (cold, fever, infection).  Wear comfortable clothing (specific to your surgery type) to the hospital.  After surgery, you can help prevent lung complications by doing breathing exercises.  Take deep breaths and cough every 1-2 hours. Your doctor may order a device called an Incentive Spirometer to help you take deep breaths. When coughing or sneezing, hold a pillow firmly against your incision with both hands. This is called "splinting." Doing this helps protect your incision. It also decreases belly discomfort.  If you are being admitted to the hospital overnight, leave your suitcase in the car. After surgery it may be brought to your room.  If you are being discharged the day of surgery, you will not be allowed to drive home. You will need a responsible adult (18 years or older) to drive you home and stay with you that night.   If you are taking public transportation, you  will need to have a responsible adult (18 years or older) with you. Please confirm with your physician that it is acceptable to use public transportation.   Please call the [facility] Dept. at [phone] if you have any questions about these instructions.  Surgery Visitation Policy:  Patients undergoing a surgery or procedure may have two family members or support persons with them as long as the person is not COVID-19  positive or experiencing its symptoms.   Inpatient Visitation:    Visiting hours are 7 a.m. to 8 p.m. Up to four visitors are allowed at one time in a patient room. The visitors may rotate out with other people during the day. One designated support person (adult) may remain overnight.  Due to an increase in RSV and influenza rates and associated hospitalizations, children ages 45 and under will not be able to visit patients in [facility] Masks continue to be strongly recommended.

## 2022-02-25 ENCOUNTER — Encounter
Admission: RE | Admit: 2022-02-25 | Discharge: 2022-02-25 | Disposition: A | Discharge status: Home / Self Care | Payer: Medicare Other | Source: Ambulatory Visit | Attending: Urology | Admitting: Urology

## 2022-02-25 ENCOUNTER — Other Ambulatory Visit: Payer: Self-pay

## 2022-02-25 ENCOUNTER — Inpatient Hospital Stay: Admission: RE | Admit: 2022-02-25 | Payer: Medicare Other | Source: Ambulatory Visit

## 2022-02-25 HISTORY — DX: Anxiety disorder, unspecified: F41.9

## 2022-02-25 HISTORY — DX: Atherosclerotic heart disease of native coronary artery without angina pectoris: I25.10

## 2022-02-25 HISTORY — DX: Depression, unspecified: F32.A

## 2022-02-25 HISTORY — DX: Pneumonia, unspecified organism: J18.9

## 2022-02-25 HISTORY — DX: Dyspnea, unspecified: R06.00

## 2022-02-25 NOTE — Progress Notes (Signed)
Attempt to do pre-op interview; no answer. Dr. Doristine Counter office made aware that patient has not been able to be contacted to do pre-op interview nor come in for required labs.

## 2022-02-25 NOTE — Patient Instructions (Addendum)
Your procedure is scheduled on: 02/27/22 - Friday Report to the Registration Desk on the 1st floor of the [facility]. To find out your arrival time, please call [phone] between 1PM - 3PM on: If your arrival time is 6:00 am, do not arrive prior to that time as the [facility] entrance doors do not open until 6:00 am.  REMEMBER: Instructions that are not followed completely may result in serious medical risk, up to and including death; or upon the discretion of your surgeon and anesthesiologist your surgery may need to be rescheduled.  Do not eat food or drinking any liquids after midnight the night before surgery.  No gum chewing, lozengers or hard candies.   TAKE THESE MEDICATIONS THE MORNING OF SURGERY WITH A SIP OF WATER:  - alfuzosin (UROXATRAL)  - morphine (MS CONTIN)  - predniSONE (DELTASONE)  - ipratropium-albuterol (DUONEB)  - albuterol (VENTOLIN HFA) bring to the hospital  - TRELEGY ELLIPTA   Holding Aspirin beginning 02/23/22 per patient.  One week prior to surgery: Stop Anti-inflammatories (NSAIDS) such as Advil, Aleve, Ibuprofen, Motrin, Naproxen, Naprosyn and Aspirin based products such as Excedrin, Goodys Powder, BC Powder.  Stop ANY OVER THE COUNTER supplements until after surgery: Cholecalciferol (VITAMIN D-3)  You may however, continue to take Tylenol if needed for pain up until the day of surgery.  No Alcohol for 24 hours before or after surgery.  No Smoking including e-cigarettes for 24 hours prior to surgery.  No chewable tobacco products for at least 6 hours prior to surgery.  No nicotine patches on the day of surgery.  Do not use any "recreational" drugs for at least a week prior to your surgery.  Please be advised that the combination of cocaine and anesthesia may have negative outcomes, up to and including death. If you test positive for cocaine, your surgery will be cancelled.  On the morning of surgery brush your teeth with toothpaste and  water, you may rinse your mouth with mouthwash if you wish. Do not swallow any toothpaste or mouthwash.  Use CHG Soap or wipes as directed on instruction sheet.  Do not wear jewelry, make-up, hairpins, clips or nail polish.  Do not wear lotions, powders, or perfumes.   Do not shave body from the neck down 48 hours prior to surgery just in case you cut yourself which could leave a site for infection.  Also, freshly shaved skin may become irritated if using the CHG soap.  Contact lenses, hearing aids and dentures may not be worn into surgery.  Do not bring valuables to the hospital. [facility] is not responsible for any missing/lost belongings or valuables.   Notify your doctor if there is any change in your medical condition (cold, fever, infection).  Wear comfortable clothing (specific to your surgery type) to the hospital.  After surgery, you can help prevent lung complications by doing breathing exercises.  Take deep breaths and cough every 1-2 hours. Your doctor may order a device called an Incentive Spirometer to help you take deep breaths. When coughing or sneezing, hold a pillow firmly against your incision with both hands. This is called "splinting." Doing this helps protect your incision. It also decreases belly discomfort.  If you are being admitted to the hospital overnight, leave your suitcase in the car. After surgery it may be brought to your room.  If you are being discharged the day of surgery, you will not be allowed to drive home. You will need a responsible adult (  18 years or older) to drive you home and stay with you that night.   If you are taking public transportation, you will need to have a responsible adult (18 years or older) with you. Please confirm with your physician that it is acceptable to use public transportation.   Please call the [facility] Dept. at [phone] if you have any questions about these instructions.  Surgery Visitation  Policy:  Patients undergoing a surgery or procedure may have two family members or support persons with them as long as the person is not COVID-19 positive or experiencing its symptoms.   Inpatient Visitation:    Visiting hours are 7 a.m. to 8 p.m. Up to four visitors are allowed at one time in a patient room. The visitors may rotate out with other people during the day. One designated support person (adult) may remain overnight.  Due to an increase in RSV and influenza rates and associated hospitalizations, children ages 30 and under will not be able to visit patients in [facility]. Masks continue to be strongly recommended.

## 2022-02-26 ENCOUNTER — Encounter
Admission: RE | Admit: 2022-02-26 | Discharge: 2022-02-26 | Disposition: A | Discharge status: Home / Self Care | Payer: Medicare Other | Source: Ambulatory Visit | Attending: Urology | Admitting: Urology

## 2022-02-26 DIAGNOSIS — Z01812 Encounter for preprocedural laboratory examination: Secondary | ICD-10-CM

## 2022-02-26 DIAGNOSIS — Z01818 Encounter for other preprocedural examination: Secondary | ICD-10-CM | POA: Diagnosis present

## 2022-02-26 DIAGNOSIS — J449 Chronic obstructive pulmonary disease, unspecified: Secondary | ICD-10-CM | POA: Diagnosis not present

## 2022-02-26 LAB — BASIC METABOLIC PANEL
Anion gap: 7 (ref 5–15)
BUN: 10 mg/dL (ref 6–20)
CO2: 28 mmol/L (ref 22–32)
Calcium: 8.6 mg/dL — ABNORMAL LOW (ref 8.9–10.3)
Chloride: 105 mmol/L (ref 98–111)
Creatinine, Ser: 0.9 mg/dL (ref 0.61–1.24)
GFR, Estimated: >60 mL/min (ref >60)
Glucose, Bld: 121 mg/dL — ABNORMAL HIGH (ref 70–99)
Potassium: 4.2 mmol/L (ref 3.5–5.1)
Sodium: 140 mmol/L (ref 135–145)

## 2022-02-26 LAB — CBC
HCT: 38.8 % — ABNORMAL LOW (ref 39.0–52.0)
Hemoglobin: 13.1 g/dL (ref 13.0–17.0)
MCH: 32.1 pg (ref 26.0–34.0)
MCHC: 33.8 g/dL (ref 30.0–36.0)
MCV: 95.1 fL (ref 80.0–100.0)
Platelets: 267 10*3/uL (ref 150–400)
RBC: 4.08 MIL/uL — ABNORMAL LOW (ref 4.22–5.81)
RDW: 13.3 % (ref 11.5–15.5)
WBC: 8.8 10*3/uL (ref 4.0–10.5)
nRBC: 0 % (ref 0.0–0.2)

## 2022-02-26 NOTE — Progress Notes (Signed)
Pulmonary clearance form received from Dr. Raul Del. Patient is optimized for surgery from a pulmonary standpoint and indicated as medium risk assessment.

## 2022-02-27 ENCOUNTER — Encounter: Payer: Self-pay | Admitting: Urology

## 2022-02-27 ENCOUNTER — Ambulatory Visit: Payer: Medicare Other | Admitting: Anesthesiology

## 2022-02-27 ENCOUNTER — Ambulatory Visit
Admission: RE | Admit: 2022-02-27 | Discharge: 2022-02-27 | Disposition: A | Discharge status: Home / Self Care | Payer: Medicare Other | Attending: Urology | Admitting: Urology

## 2022-02-27 ENCOUNTER — Encounter
Admission: RE | Disposition: A | Discharge status: Home / Self Care | Payer: Self-pay | Source: Home / Self Care | Attending: Urology

## 2022-02-27 DIAGNOSIS — N5082 Scrotal pain: Secondary | ICD-10-CM | POA: Diagnosis not present

## 2022-02-27 DIAGNOSIS — I2510 Atherosclerotic heart disease of native coronary artery without angina pectoris: Secondary | ICD-10-CM | POA: Diagnosis not present

## 2022-02-27 DIAGNOSIS — J449 Chronic obstructive pulmonary disease, unspecified: Secondary | ICD-10-CM | POA: Diagnosis not present

## 2022-02-27 DIAGNOSIS — R569 Unspecified convulsions: Secondary | ICD-10-CM | POA: Insufficient documentation

## 2022-02-27 DIAGNOSIS — F4310 Post-traumatic stress disorder, unspecified: Secondary | ICD-10-CM | POA: Insufficient documentation

## 2022-02-27 DIAGNOSIS — Z01812 Encounter for preprocedural laboratory examination: Secondary | ICD-10-CM

## 2022-02-27 DIAGNOSIS — R3129 Other microscopic hematuria: Secondary | ICD-10-CM | POA: Diagnosis not present

## 2022-02-27 DIAGNOSIS — F17210 Nicotine dependence, cigarettes, uncomplicated: Secondary | ICD-10-CM | POA: Insufficient documentation

## 2022-02-27 HISTORY — PX: CYSTOSCOPY: SHX5120

## 2022-02-27 SURGERY — CYSTOSCOPY
Anesthesia: General

## 2022-02-27 MED ORDER — PROPOFOL 10 MG/ML IV BOLUS
INTRAVENOUS | Status: DC | PRN
  Administered 2022-02-27: 140 mg via INTRAVENOUS

## 2022-02-27 MED ORDER — PROPOFOL 1000 MG/100ML IV EMUL
INTRAVENOUS | Status: AC
  Filled 2022-02-27: qty 100

## 2022-02-27 MED ORDER — LACTATED RINGERS IV SOLN: INTRAVENOUS | Status: DC

## 2022-02-27 MED ORDER — DEXAMETHASONE SODIUM PHOSPHATE 10 MG/ML IJ SOLN
INTRAMUSCULAR | Status: DC | PRN
  Administered 2022-02-27: 10 mg via INTRAVENOUS

## 2022-02-27 MED ORDER — EPHEDRINE SULFATE (PRESSORS) 50 MG/ML IJ SOLN
INTRAMUSCULAR | Status: DC | PRN
  Administered 2022-02-27 (×2): 5 mg via INTRAVENOUS

## 2022-02-27 MED ORDER — OXYCODONE HCL 5 MG PO TABS: 5.0000 mg | ORAL_TABLET | Freq: Once | ORAL | Status: DC | PRN

## 2022-02-27 MED ORDER — CEFAZOLIN SODIUM-DEXTROSE 2-4 GM/100ML-% IV SOLN
INTRAVENOUS | Status: AC
  Filled 2022-02-27: qty 100

## 2022-02-27 MED ORDER — ACETAMINOPHEN 10 MG/ML IV SOLN: 1000.0000 mg | Freq: Once | INTRAVENOUS | Status: DC | PRN

## 2022-02-27 MED ORDER — LIDOCAINE HCL (CARDIAC) PF 100 MG/5ML IV SOSY
PREFILLED_SYRINGE | INTRAVENOUS | Status: DC | PRN
  Administered 2022-02-27: 90 mg via INTRAVENOUS

## 2022-02-27 MED ORDER — EPHEDRINE 5 MG/ML INJ
INTRAVENOUS | Status: AC
  Filled 2022-02-27: qty 5

## 2022-02-27 MED ORDER — MIDAZOLAM HCL 2 MG/2ML IJ SOLN: 2.0000 mg | Freq: Once | INTRAMUSCULAR | Status: AC

## 2022-02-27 MED ORDER — FENTANYL CITRATE (PF) 100 MCG/2ML IJ SOLN
INTRAMUSCULAR | Status: AC
  Filled 2022-02-27: qty 2

## 2022-02-27 MED ORDER — CHLORHEXIDINE GLUCONATE 0.12 % MT SOLN: 15.0000 mL | Freq: Once | OROMUCOSAL | Status: AC

## 2022-02-27 MED ORDER — STERILE WATER FOR IRRIGATION IR SOLN
Status: DC | PRN
  Administered 2022-02-27: 500 mL
  Administered 2022-02-27: 1000 mL

## 2022-02-27 MED ORDER — MIDAZOLAM HCL 2 MG/2ML IJ SOLN
INTRAMUSCULAR | Status: AC
  Filled 2022-02-27: qty 2

## 2022-02-27 MED ORDER — MIDAZOLAM HCL 2 MG/2ML IJ SOLN
INTRAMUSCULAR | Status: DC | PRN
  Administered 2022-02-27: 2 mg via INTRAVENOUS

## 2022-02-27 MED ORDER — DEXAMETHASONE SODIUM PHOSPHATE 10 MG/ML IJ SOLN
INTRAMUSCULAR | Status: AC
  Filled 2022-02-27: qty 1

## 2022-02-27 MED ORDER — CHLORHEXIDINE GLUCONATE 0.12 % MT SOLN
OROMUCOSAL | Status: AC
  Administered 2022-02-27: 15 mL via OROMUCOSAL
  Filled 2022-02-27: qty 15

## 2022-02-27 MED ORDER — LIDOCAINE HCL (PF) 2 % IJ SOLN
INTRAMUSCULAR | Status: AC
  Filled 2022-02-27: qty 5

## 2022-02-27 MED ORDER — FENTANYL CITRATE (PF) 100 MCG/2ML IJ SOLN
INTRAMUSCULAR | Status: DC | PRN
  Administered 2022-02-27: 50 ug via INTRAVENOUS

## 2022-02-27 MED ORDER — ONDANSETRON HCL 4 MG/2ML IJ SOLN
INTRAMUSCULAR | Status: DC | PRN
  Administered 2022-02-27: 4 mg via INTRAVENOUS

## 2022-02-27 MED ORDER — MIDAZOLAM HCL 2 MG/2ML IJ SOLN
INTRAMUSCULAR | Status: AC
  Administered 2022-02-27: 2 mg via INTRAVENOUS
  Filled 2022-02-27: qty 2

## 2022-02-27 MED ORDER — ONDANSETRON HCL 4 MG/2ML IJ SOLN: 4.0000 mg | Freq: Once | INTRAMUSCULAR | Status: DC | PRN

## 2022-02-27 MED ORDER — CEFAZOLIN SODIUM-DEXTROSE 2-4 GM/100ML-% IV SOLN
2.0000 g | INTRAVENOUS | Status: AC
  Administered 2022-02-27: 2 g via INTRAVENOUS

## 2022-02-27 MED ORDER — FENTANYL CITRATE (PF) 100 MCG/2ML IJ SOLN: 25.0000 ug | INTRAMUSCULAR | Status: DC | PRN

## 2022-02-27 MED ORDER — ORAL CARE MOUTH RINSE: 15.0000 mL | Freq: Once | OROMUCOSAL | Status: AC

## 2022-02-27 MED ORDER — OXYCODONE HCL 5 MG/5ML PO SOLN: 5.0000 mg | Freq: Once | ORAL | Status: DC | PRN

## 2022-02-27 MED ORDER — ONDANSETRON HCL 4 MG/2ML IJ SOLN
INTRAMUSCULAR | Status: AC
  Filled 2022-02-27: qty 2

## 2022-02-27 SURGICAL SUPPLY — 26 items
BAG DRAIN SIEMENS DORNER NS (MISCELLANEOUS) ×1 IMPLANT
BAG DRN NS LF (MISCELLANEOUS) ×1
BRUSH SCRUB EZ  4% CHG (MISCELLANEOUS) ×1
BRUSH SCRUB EZ 1% IODOPHOR (MISCELLANEOUS) ×1 IMPLANT
BRUSH SCRUB EZ 4% CHG (MISCELLANEOUS) ×1 IMPLANT
CATH URETL OPEN 5X70 (CATHETERS) ×1 IMPLANT
DRSG TELFA 3X4 N-ADH STERILE (GAUZE/BANDAGES/DRESSINGS) ×1 IMPLANT
ELECT REM PT RETURN 9FT ADLT (ELECTROSURGICAL) ×1
ELECTRODE REM PT RTRN 9FT ADLT (ELECTROSURGICAL) ×1 IMPLANT
GAUZE 4X4 16PLY ~~LOC~~+RFID DBL (SPONGE) ×2 IMPLANT
GLOVE SURG UNDER POLY LF SZ7.5 (GLOVE) ×1 IMPLANT
GOWN STRL REUS W/ TWL LRG LVL3 (GOWN DISPOSABLE) ×1 IMPLANT
GOWN STRL REUS W/ TWL XL LVL3 (GOWN DISPOSABLE) ×1 IMPLANT
GOWN STRL REUS W/TWL LRG LVL3 (GOWN DISPOSABLE) ×1
GOWN STRL REUS W/TWL XL LVL3 (GOWN DISPOSABLE) ×1
GUIDEWIRE STR DUAL SENSOR (WIRE) ×1 IMPLANT
IV NS IRRIG 3000ML ARTHROMATIC (IV SOLUTION) ×1 IMPLANT
KIT TURNOVER CYSTO (KITS) ×1 IMPLANT
MANIFOLD NEPTUNE II (INSTRUMENTS) ×1 IMPLANT
PACK CYSTO AR (MISCELLANEOUS) ×1 IMPLANT
SET CYSTO W/LG BORE CLAMP LF (SET/KITS/TRAYS/PACK) ×1 IMPLANT
SURGILUBE 2OZ TUBE FLIPTOP (MISCELLANEOUS) ×1 IMPLANT
TRAP FLUID SMOKE EVACUATOR (MISCELLANEOUS) ×1 IMPLANT
WATER STERILE IRR 1000ML POUR (IV SOLUTION) ×1 IMPLANT
WATER STERILE IRR 3000ML UROMA (IV SOLUTION) ×1 IMPLANT
WATER STERILE IRR 500ML POUR (IV SOLUTION) ×1 IMPLANT

## 2022-02-27 NOTE — Anesthesia Procedure Notes (Signed)
Procedure Name: LMA Insertion Date/Time: 02/27/2022 12:43 PM  Performed by: Charnika Herbst, [place], CRNAPre-anesthesia Checklist: Patient identified, Patient being monitored, Timeout performed, Emergency Drugs available and Suction available Patient Re-evaluated:Patient Re-evaluated prior to induction Oxygen Delivery Method: Circle system utilized Preoxygenation: Pre-oxygenation with 100% oxygen Induction Type: IV induction Ventilation: Mask ventilation without difficulty LMA: LMA inserted Tube type: Oral Tube size: 4.0 mm Number of attempts: 1 Placement Confirmation: positive ETCO2 and breath sounds checked- equal and bilateral Tube secured with: Tape Dental Injury: Teeth and Oropharynx as per pre-operative assessment

## 2022-02-27 NOTE — Discharge Instructions (Signed)

## 2022-02-27 NOTE — Interval H&P Note (Signed)
UROLOGY H&P UPDATE  Agree with prior H&P dated 01/29/2022 by [name], PA.  Comorbid 61 year old male with microscopic hematuria, CT urogram normal, patient refused cystoscopy in clinic, and was scheduled for cystoscopy in the operating room with possible biopsy if any abnormal bladder lesions found.  Cardiac: RRR Lungs: CTA bilaterally  Laterality: n/a Procedure: Cystoscopy, possible bladder biopsy and fulguration  Urine: Culture 12/20 no growth  Informed consent obtained, we specifically discussed the risks of bleeding, infection, post-operative pain, bladder injury, possible need for catheter placement, possible need for additional procedures.  Jared Co, MD 02/27/2022

## 2022-02-27 NOTE — Anesthesia Postprocedure Evaluation (Signed)
Anesthesia Post Note  Patient: Jared Farley  Procedure(s) Performed: CYSTOSCOPY  Patient location during evaluation: PACU Anesthesia Type: General Level of consciousness: awake Pain management: satisfactory to patient Respiratory status: spontaneous breathing and nonlabored ventilation Cardiovascular status: stable Anesthetic complications: no  No notable events documented.   Last Vitals:  Vitals:   02/27/22 1305 02/27/22 1317  BP: 94/65 98/64  Pulse: 60 73  Resp: (!) 23 14  Temp:  (!) 36.3 C  SpO2: 100% 93%    Last Pain:  Vitals:   02/27/22 1305  TempSrc:   PainSc: 0-No pain                 VAN STAVEREN,Zuriel Roskos

## 2022-02-27 NOTE — Anesthesia Preprocedure Evaluation (Addendum)
Anesthesia Evaluation  Patient identified by MRN, date of birth, ID band Patient awake    Reviewed: Allergy & Precautions, NPO status , Patient's Chart, lab work & pertinent test results  History of Anesthesia Complications Negative for: history of anesthetic complications  Airway Mallampati: III   Neck ROM: Full    Dental  (+) Poor Dentition   Pulmonary COPD, Current Smoker (1 ppd)Patient did not abstain from smoking.   Pulmonary exam normal breath sounds clear to auscultation       Cardiovascular + CAD  Normal cardiovascular exam Rhythm:Regular Rate:Normal  ECG 02/26/22:  normal   Neuro/Psych Seizures - (last sz few months ago),  PSYCHIATRIC DISORDERS (PTSD) Anxiety Depression       GI/Hepatic negative GI ROS,,,  Endo/Other  negative endocrine ROS    Renal/GU negative Renal ROS     Musculoskeletal   Abdominal   Peds  Hematology negative hematology ROS (+)   Anesthesia Other Findings   Reproductive/Obstetrics                             Anesthesia Physical Anesthesia Plan  ASA: 2  Anesthesia Plan: General   Post-op Pain Management:    Induction: Intravenous  PONV Risk Score and Plan: 1 and Treatment may vary due to age or medical condition  Airway Management Planned: LMA  Additional Equipment:   Intra-op Plan:   Post-operative Plan: Extubation in OR  Informed Consent: I have reviewed the patients History and Physical, chart, labs and discussed the procedure including the risks, benefits and alternatives for the proposed anesthesia with the patient or authorized representative who has indicated his/her understanding and acceptance.     Dental advisory given  Plan Discussed with: CRNA  Anesthesia Plan Comments: (Patient consented for risks of anesthesia including but not limited to:  - adverse reactions to medications - damage to eyes, teeth, lips or other oral  mucosa - nerve damage due to positioning  - sore throat or hoarseness - damage to heart, brain, nerves, lungs, other parts of body or loss of life  Informed patient about role of CRNA in peri- and intra-operative care.  Patient voiced understanding.)        Anesthesia Quick Evaluation

## 2022-02-27 NOTE — Op Note (Signed)
Date of procedure: 02/27/22  Preoperative diagnosis:  Microscopic hematuria  Postoperative diagnosis:  Same  Procedure: Cystoscopy  Surgeon: Nickolas Madrid, MD  Anesthesia: General  Complications: None  Intraoperative findings:  Small prostate with mild bladder neck elevation, ureteral orifices orthotopic bilaterally, bladder mucosa grossly normal throughout  EBL: None  Specimens: None  Drains: None  Indication: Jared Farley is a 61 y.o. patient with a number of comorbidities who had microscopic hematuria and our PA [name] recommended further evaluation.  CT urogram was benign, and he refused clinic cystoscopy and was scheduled for cystoscopy and possible bladder biopsy in the OR.  After reviewing the management options for treatment, they elected to proceed with the above surgical procedure(s). We have discussed the potential benefits and risks of the procedure, side effects of the proposed treatment, the likelihood of the patient achieving the goals of the procedure, and any potential problems that might occur during the procedure or recuperation. Informed consent has been obtained.  Description of procedure:  The patient was taken to the operating room and general anesthesia was induced. SCDs were placed for DVT prophylaxis. The patient was placed in the dorsal lithotomy position, prepped and draped in the usual sterile fashion, and preoperative antibiotics(Ancef) were administered. A preoperative time-out was performed.   A 21 French rigid cystoscope was used to intubate the urethra and a normal-appearing urethra was followed proximally into the bladder.  The prostate was small with mild bladder neck elevation.  Thorough cystoscopy showed no suspicious lesions, the ureteral orifices were orthotopic bilaterally.  The bladder was drained and this concluded our procedure  Disposition: Stable to PACU  Plan: Follow-up with [name], PA in 6 months for  PVR  Nickolas Madrid, MD

## 2022-02-27 NOTE — Transfer of Care (Signed)
Immediate Anesthesia Transfer of Care Note  Patient: Jared Farley  Procedure(s) Performed: CYSTOSCOPY  Patient Location: PACU  Anesthesia Type:General  Level of Consciousness: drowsy  Airway & Oxygen Therapy: Patient Spontanous Breathing and Patient connected to face mask oxygen  Post-op Assessment: Report given to RN and Post -op Vital signs reviewed and stable  Post vital signs: Reviewed and stable  Last Vitals:  Vitals Value Taken Time  BP 97/67 02/27/22 1300  Temp 97.3   Pulse 62 02/27/22 1303  Resp 14 02/27/22 1303  SpO2 100 % 02/27/22 1303  Vitals shown include unvalidated device data.  Last Pain:  Vitals:   02/27/22 1112  TempSrc: Temporal  PainSc: 2       Patients Stated Pain Goal: 0 (41/93/79 0240)  Complications: No notable events documented.

## 2022-02-28 ENCOUNTER — Encounter: Payer: Self-pay | Admitting: Urology

## 2022-03-04 ENCOUNTER — Telehealth: Payer: Self-pay | Admitting: Urology

## 2022-03-04 ENCOUNTER — Ambulatory Visit: Payer: Medicare Other | Admitting: Psychiatry

## 2022-03-04 ENCOUNTER — Other Ambulatory Visit: Payer: Self-pay | Admitting: Urology

## 2022-03-04 NOTE — Telephone Encounter (Signed)
Jared Farley is requesting a refill on this testosterone, but we need to check his testosterone level.   I will go ahead and give one month refill to give him time to get in for the lab appointment.

## 2022-03-05 ENCOUNTER — Other Ambulatory Visit: Payer: Self-pay | Admitting: Family Medicine

## 2022-03-05 DIAGNOSIS — E291 Testicular hypofunction: Secondary | ICD-10-CM

## 2022-03-05 NOTE — Telephone Encounter (Signed)
Patient notified and voiced understanding. Appointment for lab has been scheduled.

## 2022-03-11 ENCOUNTER — Other Ambulatory Visit: Payer: Medicare Other

## 2022-03-11 DIAGNOSIS — E291 Testicular hypofunction: Secondary | ICD-10-CM

## 2022-03-12 ENCOUNTER — Telehealth: Payer: Self-pay | Admitting: Family Medicine

## 2022-03-12 LAB — TESTOSTERONE: Testosterone: 408 ng/dL (ref 264–916)

## 2022-03-12 LAB — HEMATOCRIT: Hematocrit: 35.5 % — ABNORMAL LOW (ref 37.5–51.0)

## 2022-03-12 NOTE — Telephone Encounter (Signed)
LMOM for patient to return call.

## 2022-03-12 NOTE — Telephone Encounter (Signed)
-----   Message from Nori Riis, PA-C sent at 03/12/2022  8:16 AM EST ----- Please let Jared Farley know that his testosterone is within therapeutic limits, but his hematocrit is low.  I do not see it in his chart where he has undergone a colonoscopy.  I recommend he speak with his primary care physician regarding scheduling one if he has not done so because a screening colonoscopy is recommended at age 62 and colon cancer can be a reason for hematocrit to be low.  I will need to see him in February for IPSS, SH IM and exam with testosterone, hemoglobin hematocrit and PSA prior.

## 2022-03-16 NOTE — Telephone Encounter (Signed)
Patient notified and voiced understanding, appointments have been scheduled.

## 2022-04-07 ENCOUNTER — Other Ambulatory Visit: Payer: Self-pay | Admitting: Urology

## 2022-04-17 ENCOUNTER — Other Ambulatory Visit: Payer: Self-pay

## 2022-04-17 DIAGNOSIS — E291 Testicular hypofunction: Secondary | ICD-10-CM

## 2022-04-17 DIAGNOSIS — N138 Other obstructive and reflux uropathy: Secondary | ICD-10-CM

## 2022-04-20 ENCOUNTER — Other Ambulatory Visit: Payer: Medicare Other

## 2022-04-20 DIAGNOSIS — E291 Testicular hypofunction: Secondary | ICD-10-CM

## 2022-04-20 DIAGNOSIS — N138 Other obstructive and reflux uropathy: Secondary | ICD-10-CM

## 2022-04-21 LAB — PSA: Prostate Specific Ag, Serum: 0.5 ng/mL (ref 0.0–4.0)

## 2022-04-21 LAB — TESTOSTERONE: Testosterone: 694 ng/dL (ref 264–916)

## 2022-04-21 LAB — HEMOGLOBIN AND HEMATOCRIT, BLOOD
Hematocrit: 39 % (ref 37.5–51.0)
Hemoglobin: 13.7 g/dL (ref 13.0–17.7)

## 2022-04-22 NOTE — Progress Notes (Unsigned)
01/29/2022 3:30 PM   [name] [birth year] [id number]  Referring provider: Gladstone Lighter, MD [place],  [place] [address]  Urological history: 1. Testosterone deficiency -contributing factors of age and chronic opioid use -testosterone (04/2022) 694 -hemoglobin/hematocrit (04/2022) 13.7/39.0   2. ED -contributing factors of age, COPD, smoking, testosterone deficiency, BPH -SHIM *** -sildenafil 100 mg, on-demand-dosing  3. BPH with LU TS -PSA (04/2022) 0.5 -I PSS *** -alfuzosin 10 mg daily  4. Chronic prostatitis  5. High risk hematuria -smoker > 30 pack year history -CTU (12/2021) no worrisome findings -cysto (02/27/2022) - NED -no reports of gross heme  Chief Complaint  Patient presents with   Hematuria   Follow-up      HPI: Jared Farley is a 62 y.o. male for follow up.     Score:  1-7 Mild 8-19 Moderate 20-35 Severe      Score: 1-7 Severe ED 8-11 Moderate ED 12-16 Mild-Moderate ED 17-21 Mild ED 22-25 No ED   PMH: Past Medical History:  Diagnosis Date   Chronic pain following surgery or procedure    testicular   COPD (chronic obstructive pulmonary disease) (HCC)    Degenerative joint disease    Seizures ([place])     Surgical History: Past Surgical History:  Procedure Laterality Date   ANKLE SURGERY     NASAL FRACTURE SURGERY     VASECTOMY      Home Medications:  Allergies as of 01/29/2022       Reactions   Trazodone And Nefazodone    Trazodone Hcl    Pt can't recall reaction        Medication List        Accurate as of January 29, 2022  3:30 PM. If you have any questions, ask your nurse or doctor.          STOP taking these medications    clomiPHENE 50 MG tablet Commonly known as: CLOMID Stopped by: Rhodie Cienfuegos, PA-C   lamoTRIgine 25 MG tablet Commonly known as: LaMICtal Stopped by: [name], PA-C   sildenafil 100 MG tablet Commonly known as: VIAGRA Stopped by:  Medina Degraffenreid, PA-C       TAKE these medications    albuterol 108 (90 Base) MCG/ACT inhaler Commonly known as: VENTOLIN HFA Inhale into the lungs.   alfuzosin 10 MG 24 hr tablet Commonly known as: UROXATRAL Take 1 tablet (10 mg total) by mouth daily with breakfast.   aspirin 81 MG tablet Take 81 mg by mouth daily.   ipratropium-albuterol 0.5-2.5 (3) MG/3ML Soln Commonly known as: DUONEB SMARTSIG:3 Milliliter(s) Via Nebulizer Every 6 Hours   lidocaine 5 % Commonly known as: LIDODERM 1 patch daily.   morphine 60 MG 12 hr tablet Commonly known as: MS CONTIN Take 60 mg by mouth 3 (three) times daily.   oxyCODONE 15 MG immediate release tablet Commonly known as: ROXICODONE oxycodone 15 mg tablet   predniSONE 10 MG tablet Commonly known as: DELTASONE Take by mouth.   Testosterone 20.25 MG/1.25GM (1.62%) Gel Place 2 Pump onto the skin daily. Apply AndroGel 1.62% only to your shoulders and upper arms that will be covered by a short-sleeve t-shirt.   triamcinolone cream 0.1 % Commonly known as: KENALOG Apply 1 application topically 2 (two) times daily.   VITAMIN D-3 PO Take by mouth.        Allergies:  Allergies  Allergen Reactions   Trazodone And Nefazodone    Trazodone Hcl  Pt can't recall reaction    Family History: Family History  Problem Relation Age of Onset   Depression Mother    Depression Brother    Suicidality Brother     Social History:  reports that he has been smoking cigarettes. He has been smoking an average of 1 pack per day. He has never used smokeless tobacco. He reports current alcohol use. He reports current drug use. Drug: Marijuana.  ROS: Pertinent ROS in HPI  Physical Exam: There were no vitals taken for this visit.  Constitutional:  Well nourished. Alert and oriented, No acute distress. HEENT: [place] AT, moist mucus membranes.  Trachea midline Cardiovascular: No clubbing, cyanosis, or edema. Respiratory: Normal respiratory  effort, no increased work of breathing. GU: No CVA tenderness.  No bladder fullness or masses.  Patient with circumcised/uncircumcised phallus. ***Foreskin easily retracted***  Urethral meatus is patent.  No penile discharge. No penile lesions or rashes. Scrotum without lesions, cysts, rashes and/or edema.  Testicles are located scrotally bilaterally. No masses are appreciated in the testicles. Left and right epididymis are normal. Rectal: Patient with  normal sphincter tone. Anus and perineum without scarring or rashes. No rectal masses are appreciated. Prostate is approximately *** grams, *** nodules are appreciated. Seminal vesicles are normal. Neurologic: Grossly intact, no focal deficits, moving all 4 extremities. Psychiatric: Normal mood and affect.   Laboratory Data: See Urological History I have reviewed the labs.    Pertinent Imaging: N/A  Assessment & Plan:     1. High risk hematuria -smoker -CTU (2023) - no worrisome findings -cysto (2023) - NED  -no reports of gross heme  2. Testosterone deficiency  -testosterone levels are therapeutic/subtherapeutic -H & H *** -continue ***  3. BPH with LUTS -PSA stable -DRE benign -UA benign -PVR < 300 cc -symptoms - *** -most bothersome symptoms are *** -continue conservative management, avoiding bladder irritants and timed voiding's -Initiate alpha-blocker (***), discussed side effects *** -Initiate 5 alpha reductase inhibitor (***), discussed side effects *** -Continue tamsulosin 0.4 mg daily, alfuzosin 10 mg daily, Rapaflo 8 mg daily, terazosin, doxazosin, Cialis 5 mg daily and finasteride 5 mg daily, dutasteride 0.5 mg daily***:refills given -Cannot tolerate medication or medication failure, schedule cystoscopy ***  4. Erectile dysfunction:    - I explained that conditions like diabetes, hypertension, coronary artery disease, peripheral vascular disease, smoking, alcohol consumption, age, sleep apnea and BPH can diminish  the ability to have an erection - I explained the ED may be a risk marker for underlying CVD and he should follow up with PCP for further studies *** - A recent study published in Sex Med 2018 Apr 13 revealed moderate to vigorous aerobic exercise for 40 minutes 4 times per week can decrease erectile problems caused by physical inactivity, obesity, hypertension, metabolic syndrome and/or cardiovascular diseases *** - We discussed trying a *** different PDE5 inhibitor, intra-urethral suppositories, intracavernous vasoactive drug injection therapy, vacuum erection devices and penile prosthesis implantation        [name], PA-C  [facility] [address] [place], [place] [address] [phone]

## 2022-04-23 ENCOUNTER — Other Ambulatory Visit: Payer: Self-pay | Admitting: Urology

## 2022-04-23 ENCOUNTER — Ambulatory Visit (INDEPENDENT_AMBULATORY_CARE_PROVIDER_SITE_OTHER): Payer: Medicare Other | Admitting: Urology

## 2022-04-23 ENCOUNTER — Encounter: Payer: Self-pay | Admitting: Urology

## 2022-04-23 VITALS — BP 134/76 | HR 99 | Ht 71.0 in | Wt 180.0 lb

## 2022-04-23 DIAGNOSIS — N401 Enlarged prostate with lower urinary tract symptoms: Secondary | ICD-10-CM | POA: Diagnosis not present

## 2022-04-23 DIAGNOSIS — E291 Testicular hypofunction: Secondary | ICD-10-CM | POA: Diagnosis not present

## 2022-04-23 DIAGNOSIS — N138 Other obstructive and reflux uropathy: Secondary | ICD-10-CM | POA: Diagnosis not present

## 2022-04-23 DIAGNOSIS — E349 Endocrine disorder, unspecified: Secondary | ICD-10-CM

## 2022-04-23 DIAGNOSIS — N529 Male erectile dysfunction, unspecified: Secondary | ICD-10-CM

## 2022-04-23 DIAGNOSIS — R319 Hematuria, unspecified: Secondary | ICD-10-CM

## 2022-04-23 MED ORDER — SILODOSIN 8 MG PO CAPS: 8.0000 mg | ORAL_CAPSULE | Freq: Every day | ORAL | 3 refills | Status: DC

## 2022-04-23 MED ORDER — ANDRODERM 4 MG/24HR TD PT24: 1.0000 | MEDICATED_PATCH | Freq: Every day | TRANSDERMAL | 3 refills | Status: DC

## 2022-04-23 MED ORDER — SILODOSIN 8 MG PO CAPS: 8.0000 mg | ORAL_CAPSULE | Freq: Every day | ORAL | 3 refills | Status: AC

## 2022-04-24 ENCOUNTER — Other Ambulatory Visit: Payer: Self-pay | Admitting: Urology

## 2022-04-24 DIAGNOSIS — E349 Endocrine disorder, unspecified: Secondary | ICD-10-CM

## 2022-04-24 MED ORDER — ANDRODERM 4 MG/24HR TD PT24: 1.0000 | MEDICATED_PATCH | Freq: Every day | TRANSDERMAL | 3 refills | Status: DC

## 2022-04-28 ENCOUNTER — Other Ambulatory Visit: Payer: Self-pay | Admitting: Urology

## 2022-04-28 ENCOUNTER — Telehealth: Payer: Self-pay | Admitting: Urology

## 2022-04-28 DIAGNOSIS — E349 Endocrine disorder, unspecified: Secondary | ICD-10-CM

## 2022-04-28 MED ORDER — ANDRODERM 4 MG/24HR TD PT24: 1.0000 | MEDICATED_PATCH | Freq: Every day | TRANSDERMAL | 3 refills | Status: DC

## 2022-04-28 NOTE — Telephone Encounter (Signed)
Please let Jared Farley know that his pharmacy was hacked and cannot receive electronic requests.  We have printed the script out for him to take over to [facility] and he can pick it up at the front desk.

## 2022-04-28 NOTE — Telephone Encounter (Signed)
Left message to call back  

## 2022-04-30 ENCOUNTER — Telehealth: Payer: Self-pay

## 2022-04-30 NOTE — Telephone Encounter (Signed)
Per Dawn at [employer]  their wholesaler has no listing for testosterone   patches.   Pls advise. Thanks.

## 2022-04-30 NOTE — Telephone Encounter (Signed)
RX printed and faxed. 

## 2022-05-05 ENCOUNTER — Telehealth: Payer: Self-pay | Admitting: Urology

## 2022-05-05 NOTE — Telephone Encounter (Signed)
Please let Jared Farley know that the testosterone patch is no longer available.  Does he want me to renew the gel prescription?

## 2022-05-06 NOTE — Telephone Encounter (Signed)
Patient called and states he will continue on the gel .  He has some still left ,

## 2022-05-14 NOTE — Telephone Encounter (Signed)
Per SM pt will continue with his  testosterone gel.

## 2022-05-18 ENCOUNTER — Other Ambulatory Visit: Payer: Self-pay | Admitting: Urology

## 2022-05-19 NOTE — Progress Notes (Deleted)
05/21/2022 10:10 PM   Jared Farley 06/10/1960 [id number]  Referring provider: Gladstone Lighter, MD [place],  [place] [address]  Urological history: 1. Testosterone deficiency -contributing factors of age and chronic opioid use -testosterone (04/2022) 694 -hemoglobin/hematocrit (04/2022) 13.7/39.0   2. ED -contributing factors of age, COPD, smoking, testosterone deficiency, BPH -not sexually active   3. BPH with LU TS -PSA (04/2022) 0.5 -alfuzosin 10 mg daily  4. Chronic prostatitis  5. High risk hematuria -smoker > 30 pack year history -CTU (12/2021) no worrisome findings -cysto (02/27/2022) - NED -no reports of gross heme  No chief complaint on file.   HPI: Jared Farley is a 62 y.o. male for follow up after a trial of Rapaflo.    I PSS ***  PVR ***        Score:  1-7 Mild 8-19 Moderate 20-35 Severe    PMH: Past Medical History:  Diagnosis Date   Anxiety    Chronic pain following surgery or procedure    testicular   COPD (chronic obstructive pulmonary disease) (HCC)    Coronary artery disease    Degenerative joint disease    Depression    Dyspnea    Pneumonia    Seizures ([place])     Surgical History: Past Surgical History:  Procedure Laterality Date   ANKLE SURGERY     COLONOSCOPY W/ POLYPECTOMY     CYSTOSCOPY N/A 02/27/2022   Procedure: CYSTOSCOPY;  Surgeon: Billey Co, MD;  Location: [facility] ORS;  Service: Urology;  Laterality: N/A;   dental work     NASAL FRACTURE SURGERY     x 3   VASECTOMY      Home Medications:  Allergies as of 05/21/2022       Reactions   Trazodone And Nefazodone         Medication List        Accurate as of May 19, 2022 10:10 PM. If you have any questions, ask your nurse or doctor.          albuterol 108 (90 Base) MCG/ACT inhaler Commonly known as: VENTOLIN HFA Inhale into the lungs.   aspirin 81 MG tablet Take 81 mg by mouth daily. Last dose on 02/23/22  prior to procedure   dextromethorphan-guaiFENesin 30-600 MG 12hr tablet Commonly known as: MUCINEX DM Take 1 tablet by mouth as needed for cough.   ipratropium-albuterol 0.5-2.5 (3) MG/3ML Soln Commonly known as: DUONEB every 6 (six) hours as needed.   lidocaine 5 % Commonly known as: LIDODERM 1 patch as needed.   morphine 60 MG 12 hr tablet Commonly known as: MS CONTIN Take 60 mg by mouth 2 (two) times daily.   oxyCODONE 15 MG immediate release tablet Commonly known as: ROXICODONE 15 mg every 6 (six) hours as needed.   predniSONE 10 MG tablet Commonly known as: DELTASONE Take by mouth every other day.   silodosin 8 MG Caps capsule Commonly known as: Rapaflo Take 1 capsule (8 mg total) by mouth daily with breakfast.   Testosterone 1.62 % Gel PLACE 2 PUMPS ONTO THE SKIN DAILY. APPLYONLY TO YOUR SHOULDERS/UPPER ARMS THAT WILL BE COVERED BY A SHORT-SLEEVE T-SHIRT.   Trelegy Ellipta 200-62.5-25 MCG/ACT Aepb Generic drug: Fluticasone-Umeclidin-Vilant Inhale 1 puff into the lungs daily.   triamcinolone cream 0.1 % Commonly known as: KENALOG Apply 1 application topically 2 (two) times daily. What changed: when to take this   VITAMIN D-3 PO Take by mouth.  Allergies:  Allergies  Allergen Reactions   Trazodone And Nefazodone     Family History: Family History  Problem Relation Age of Onset   Depression Mother    Depression Brother    Suicidality Brother     Social History:  reports that he has been smoking cigarettes. He has been smoking an average of 1 pack per day. He has never used smokeless tobacco. He reports that he does not currently use alcohol. He reports current drug use. Drug: Marijuana.  ROS: Pertinent ROS in HPI  Physical Exam: Blood pressure 134/76, pulse 99, height 5\' 11"  (1.803 m), weight 180 lb (81.6 kg).  Constitutional:  Well nourished. Alert and oriented, No acute distress. HEENT: [place] AT, moist mucus membranes.  Trachea  midline Cardiovascular: No clubbing, cyanosis, or edema. Respiratory: Normal respiratory effort, no increased work of breathing. GU: No CVA tenderness.  No bladder fullness or masses.  Patient with circumcised/uncircumcised phallus. ***Foreskin easily retracted***  Urethral meatus is patent.  No penile discharge. No penile lesions or rashes. Scrotum without lesions, cysts, rashes and/or edema.  Testicles are located scrotally bilaterally. No masses are appreciated in the testicles. Left and right epididymis are normal. Rectal: Patient with  normal sphincter tone. Anus and perineum without scarring or rashes. No rectal masses are appreciated. Prostate is approximately *** grams, *** nodules are appreciated. Seminal vesicles are normal. Neurologic: Grossly intact, no focal deficits, moving all 4 extremities. Psychiatric: Normal mood and affect.   Laboratory Data: N/A  Pertinent Imaging: ***  Assessment & Plan:    1. High risk hematuria -smoker -CTU (2023) - no worrisome findings -cysto (2023) - NED  -no reports of gross heme  2. Testosterone deficiency  -testosterone levels are therapeutic -H & H stable - he would like to change to Androderm because he finds it difficult to apply the gel on a daily basis  -Prescription for endoderm sent to his pharmacy  3. BPH with LUTS -PSA stable -most bothersome symptoms are weak stream and feelings of incomplete bladder emptying -continue conservative management, avoiding bladder irritants and timed voiding's -Will have a trial of Rapaflo 8 mg daily in place of the alfuzosin to see if he can achieve a stronger urinary stream -Follow-up in 1 month for IPSS/PVR  4. Erectile dysfunction:    -He states he has no libido and is not sexually active     [name], [address] [place], [place] [address] [phone]

## 2022-05-21 ENCOUNTER — Ambulatory Visit: Payer: Medicare Other | Admitting: Urology

## 2022-05-21 DIAGNOSIS — N138 Other obstructive and reflux uropathy: Secondary | ICD-10-CM

## 2022-05-26 NOTE — Progress Notes (Deleted)
05/21/2022 10:10 PM   Jared Farley December 24, 1960 [id number]  Referring provider: Gladstone Lighter, MD [place],  [place] [address]  Urological history: 1. Testosterone deficiency -contributing factors of age and chronic opioid use -testosterone (04/2022) 694 -hemoglobin/hematocrit (04/2022) 13.7/39.0   2. ED -contributing factors of age, COPD, smoking, testosterone deficiency, BPH -not sexually active   3. BPH with LU TS -PSA (04/2022) 0.5 -alfuzosin 10 mg daily  4. Chronic prostatitis  5. High risk hematuria -smoker > 30 pack year history -CTU (12/2021) no worrisome findings -cysto (02/27/2022) - NED -no reports of gross heme  No chief complaint on file.   HPI: Jared Farley is a 62 y.o. male for follow up after a trial of Rapaflo.    I PSS ***  PVR ***        Score:  1-7 Mild 8-19 Moderate 20-35 Severe    PMH: Past Medical History:  Diagnosis Date   Anxiety    Chronic pain following surgery or procedure    testicular   COPD (chronic obstructive pulmonary disease) (HCC)    Coronary artery disease    Degenerative joint disease    Depression    Dyspnea    Pneumonia    Seizures ([place])     Surgical History: Past Surgical History:  Procedure Laterality Date   ANKLE SURGERY     COLONOSCOPY W/ POLYPECTOMY     CYSTOSCOPY N/A 02/27/2022   Procedure: CYSTOSCOPY;  Surgeon: Billey Co, MD;  Location: [facility] ORS;  Service: Urology;  Laterality: N/A;   dental work     NASAL FRACTURE SURGERY     x 3   VASECTOMY      Home Medications:  Allergies as of 05/21/2022       Reactions   Trazodone And Nefazodone         Medication List        Accurate as of May 19, 2022 10:10 PM. If you have any questions, ask your nurse or doctor.          albuterol 108 (90 Base) MCG/ACT inhaler Commonly known as: VENTOLIN HFA Inhale into the lungs.   aspirin 81 MG tablet Take 81 mg by mouth daily. Last dose on 02/23/22  prior to procedure   dextromethorphan-guaiFENesin 30-600 MG 12hr tablet Commonly known as: MUCINEX DM Take 1 tablet by mouth as needed for cough.   ipratropium-albuterol 0.5-2.5 (3) MG/3ML Soln Commonly known as: DUONEB every 6 (six) hours as needed.   lidocaine 5 % Commonly known as: LIDODERM 1 patch as needed.   morphine 60 MG 12 hr tablet Commonly known as: MS CONTIN Take 60 mg by mouth 2 (two) times daily.   oxyCODONE 15 MG immediate release tablet Commonly known as: ROXICODONE 15 mg every 6 (six) hours as needed.   predniSONE 10 MG tablet Commonly known as: DELTASONE Take by mouth every other day.   silodosin 8 MG Caps capsule Commonly known as: Rapaflo Take 1 capsule (8 mg total) by mouth daily with breakfast.   Testosterone 1.62 % Gel PLACE 2 PUMPS ONTO THE SKIN DAILY. APPLYONLY TO YOUR SHOULDERS/UPPER ARMS THAT WILL BE COVERED BY A SHORT-SLEEVE T-SHIRT.   Trelegy Ellipta 200-62.5-25 MCG/ACT Aepb Generic drug: Fluticasone-Umeclidin-Vilant Inhale 1 puff into the lungs daily.   triamcinolone cream 0.1 % Commonly known as: KENALOG Apply 1 application topically 2 (two) times daily. What changed: when to take this   VITAMIN D-3 PO Take by mouth.  Allergies:  Allergies  Allergen Reactions   Trazodone And Nefazodone     Family History: Family History  Problem Relation Age of Onset   Depression Mother    Depression Brother    Suicidality Brother     Social History:  reports that he has been smoking cigarettes. He has been smoking an average of 1 pack per day. He has never used smokeless tobacco. He reports that he does not currently use alcohol. He reports current drug use. Drug: Marijuana.  ROS: Pertinent ROS in HPI  Physical Exam: Blood pressure 134/76, pulse 99, height 5\' 11"  (1.803 m), weight 180 lb (81.6 kg).  Constitutional:  Well nourished. Alert and oriented, No acute distress. HEENT: Jared Farley AT, moist mucus membranes.  Trachea  midline Cardiovascular: No clubbing, cyanosis, or edema. Respiratory: Normal respiratory effort, no increased work of breathing. GU: No CVA tenderness.  No bladder fullness or masses.  Patient with circumcised/uncircumcised phallus. ***Foreskin easily retracted***  Urethral meatus is patent.  No penile discharge. No penile lesions or rashes. Scrotum without lesions, cysts, rashes and/or edema.  Testicles are located scrotally bilaterally. No masses are appreciated in the testicles. Left and right epididymis are normal. Rectal: Patient with  normal sphincter tone. Anus and perineum without scarring or rashes. No rectal masses are appreciated. Prostate is approximately *** grams, *** nodules are appreciated. Seminal vesicles are normal. Neurologic: Grossly intact, no focal deficits, moving all 4 extremities. Psychiatric: Normal mood and affect.   Laboratory Data: N/A  Pertinent Imaging: ***  Assessment & Plan:    1. High risk hematuria -smoker -CTU (2023) - no worrisome findings -cysto (2023) - NED  -no reports of gross heme  2. Testosterone deficiency  -testosterone levels are therapeutic -H & H stable -Back on the gel as Androderm is no longer available  3. BPH with LUTS -PSA stable -most bothersome symptoms are weak stream and feelings of incomplete bladder emptying -continue conservative management, avoiding bladder irritants and timed voiding's -Will have a trial of Rapaflo 8 mg daily in place of the alfuzosin to see if he can achieve a stronger urinary stream -Follow-up in 1 month for IPSS/PVR  4. Erectile dysfunction:    -He states he has no libido and is not sexually active     [name], [place], [place] [address] [phone]

## 2022-05-27 ENCOUNTER — Encounter: Payer: Self-pay | Admitting: Urology

## 2022-05-27 ENCOUNTER — Ambulatory Visit: Payer: Medicare Other | Admitting: Urology

## 2022-05-27 DIAGNOSIS — N138 Other obstructive and reflux uropathy: Secondary | ICD-10-CM

## 2022-05-27 DIAGNOSIS — N529 Male erectile dysfunction, unspecified: Secondary | ICD-10-CM

## 2022-05-27 DIAGNOSIS — E349 Endocrine disorder, unspecified: Secondary | ICD-10-CM

## 2022-05-27 DIAGNOSIS — R319 Hematuria, unspecified: Secondary | ICD-10-CM

## 2022-06-09 NOTE — Progress Notes (Unsigned)
06/10/2022 4:05 PM   Jared Farley 12-10-1960 [id number]  Referring provider: Enid Baas, MD [address] [place],  [place] [address]  Urological history: 1. Testosterone deficiency -contributing factors of age and chronic opioid use -testosterone (04/2022) 694 -hemoglobin/hematocrit (04/2022) 13.7/39.0   2. ED -contributing factors of age, COPD, smoking, testosterone deficiency, BPH -not sexually active   3. BPH with LU TS -PSA (04/2022) 0.5 -alfuzosin 10 mg daily  4. Chronic prostatitis  5. High risk hematuria -smoker > 30 pack year history -CTU (12/2021) no worrisome findings -cysto (02/27/2022) - NED -no reports of gross heme  Chief Complaint  Patient presents with   Hematuria    HPI: Jared Farley is a 62 y.o. male for follow up after a trial of Rapaflo.    He is using the testosterone gel, but he finds it difficult through remember to place it on daily.  He is wondering if there are other options.  I PSS 7/1  PVR 0 mL  He states the Rapaflo makes him feel like he is 62 years old again.  He is having no urinary complaints.  Patient denies any modifying or aggravating factors.  Patient denies any gross hematuria, dysuria or suprapubic/flank pain.  Patient denies any fevers, chills, nausea or vomiting.       IPSS     Row Name 06/10/22 1500         International Prostate Symptom Score   How often have you had the sensation of not emptying your bladder? Less than 1 in 5     How often have you had to urinate less than every two hours? Less than 1 in 5 times     How often have you found you stopped and started again several times when you urinated? Less than 1 in 5 times     How often have you found it difficult to postpone urination? Less than 1 in 5 times     How often have you had a weak urinary stream? Less than 1 in 5 times     How often have you had to strain to start urination? Less than 1 in 5 times     How many times did you  typically get up at night to urinate? 1 Time     Total IPSS Score 7       Quality of Life due to urinary symptoms   If you were to spend the rest of your life with your urinary condition just the way it is now how would you feel about that? Pleased               Score:  1-7 Mild 8-19 Moderate 20-35 Severe    PMH: Past Medical History:  Diagnosis Date   Anxiety    Chronic pain following surgery or procedure    testicular   COPD (chronic obstructive pulmonary disease)    Coronary artery disease    Degenerative joint disease    Depression    Dyspnea    Pneumonia    Seizures     Surgical History: Past Surgical History:  Procedure Laterality Date   ANKLE SURGERY     COLONOSCOPY W/ POLYPECTOMY     CYSTOSCOPY N/A 02/27/2022   Procedure: CYSTOSCOPY;  Surgeon: Sondra Come, MD;  Location: [facility] ORS;  Service: Urology;  Laterality: N/A;   dental work     NASAL FRACTURE SURGERY     x 3   VASECTOMY  Home Medications:  Allergies as of 06/10/2022       Reactions   Trazodone And Nefazodone         Medication List        Accurate as of June 10, 2022  4:05 PM. If you have any questions, ask your nurse or doctor.          albuterol 108 (90 Base) MCG/ACT inhaler Commonly known as: VENTOLIN HFA Inhale into the lungs.   aspirin 81 MG tablet Take 81 mg by mouth daily. Last dose on 02/23/22 prior to procedure   dextromethorphan-guaiFENesin 30-600 MG 12hr tablet Commonly known as: MUCINEX DM Take 1 tablet by mouth as needed for cough.   ipratropium-albuterol 0.5-2.5 (3) MG/3ML Soln Commonly known as: DUONEB every 6 (six) hours as needed.   lidocaine 5 % Commonly known as: LIDODERM 1 patch as needed.   morphine 60 MG 12 hr tablet Commonly known as: MS CONTIN Take 60 mg by mouth 2 (two) times daily.   oxyCODONE 15 MG immediate release tablet Commonly known as: ROXICODONE 15 mg every 6 (six) hours as needed.   predniSONE 10 MG tablet Commonly  known as: DELTASONE Take by mouth every other day.   silodosin 8 MG Caps capsule Commonly known as: Rapaflo Take 1 capsule (8 mg total) by mouth daily with breakfast.   Testosterone 1.62 % Gel PLACE 2 PUMPS ONTO THE SKIN DAILY. APPLYONLY TO YOUR SHOULDERS/UPPER ARMS THAT WILL BE COVERED BY A SHORT-SLEEVE T-SHIRT.   Trelegy Ellipta 200-62.5-25 MCG/ACT Aepb Generic drug: Fluticasone-Umeclidin-Vilant Inhale 1 puff into the lungs daily.   triamcinolone cream 0.1 % Commonly known as: KENALOG Apply 1 application topically 2 (two) times daily. What changed: when to take this   VITAMIN D-3 PO Take by mouth.        Allergies:  Allergies  Allergen Reactions   Trazodone And Nefazodone     Family History: Family History  Problem Relation Age of Onset   Depression Mother    Depression Brother    Suicidality Brother     Social History:  reports that he has been smoking cigarettes. He has been smoking an average of 1 pack per day. He has never used smokeless tobacco. He reports that he does not currently use alcohol. He reports current drug use. Drug: Marijuana.  ROS: Pertinent ROS in HPI  Physical Exam: Blood pressure 134/76, pulse 99, height 5\' 11"  (1.803 m), weight 180 lb (81.6 kg).  Constitutional:  Well nourished. Alert and oriented, No acute distress. HEENT: [place] AT, moist mucus membranes.  Trachea midline Cardiovascular: No clubbing, cyanosis, or edema. Respiratory: Normal respiratory effort, no increased work of breathing. Neurologic: Grossly intact, no focal deficits, moving all 4 extremities. Psychiatric: Normal mood and affect.   Laboratory Data: N/A  Pertinent Imaging:  06/10/22 15:06  Scan Result 0    Assessment & Plan:    1. High risk hematuria -smoker -CTU (2023) - no worrisome findings -cysto (2023) - NED  -no reports of gross heme  2. Testosterone deficiency  -testosterone levels are therapeutic -H & H stable -Back on the gel as Androderm is no  longer available -Will switch to Testopel next month  3. BPH with LUTS -continue Rapaflo 8 mg daily   4. Erectile dysfunction:    -He states he has no libido and is not sexually active  5. Scrotal pain -he asked my opinion regarding his opioid use for his chronic pain.  I explained to him that I felt  that we did not have a good understanding on the causality of chronic pain and because of this, we do not have an ideal treatment.  I also explained that I felt we do not have a good grasp on addiction regarding who is at risk for developing addiction and how to treat addictions.  He is currently seeing Dr. Omar Person for his pain control with opiates and he is concerned that he will retire soon and he will have a difficult time finding another provider to write his opioid prescription for him.  I advised him to start researching now and getting established with another practitioner who specializes in chronic pain so that he is prepared for the day when Dr. Kerney Elbe does retire.  I also explained that I am not an expert regarding chronic pain or the treatments.        Cloretta Ned  [facility] [address]  [address] [place], [place] [address] [phone]  I spent 30 minutes on the day of the encounter to include pre-visit record review, face-to-face time with the patient, and post-visit ordering of tests.

## 2022-06-10 ENCOUNTER — Encounter: Payer: Self-pay | Admitting: Urology

## 2022-06-10 ENCOUNTER — Ambulatory Visit: Payer: Medicare Other | Admitting: Urology

## 2022-06-10 VITALS — BP 133/68 | HR 102 | Ht 71.0 in | Wt 180.0 lb

## 2022-06-10 DIAGNOSIS — N401 Enlarged prostate with lower urinary tract symptoms: Secondary | ICD-10-CM

## 2022-06-10 DIAGNOSIS — E291 Testicular hypofunction: Secondary | ICD-10-CM

## 2022-06-10 DIAGNOSIS — N5082 Scrotal pain: Secondary | ICD-10-CM

## 2022-06-10 DIAGNOSIS — N138 Other obstructive and reflux uropathy: Secondary | ICD-10-CM

## 2022-06-10 DIAGNOSIS — E349 Endocrine disorder, unspecified: Secondary | ICD-10-CM

## 2022-06-10 LAB — BLADDER SCAN AMB NON-IMAGING: Scan Result: 0

## 2022-07-14 NOTE — Progress Notes (Unsigned)
He presents today for Testopel insertion.  Patient is placed on the exam table in the *** lateral jackknife position.  Identified upper outer quadrant of hip for insertion; prepped area with *** and injected *** cc's of Lidocaine 1% with Epinephrine to anesthetize superficially and distally along trocar tract.  Made 3 mm incision using 11 blade of scalpel; trocar with sharp ended stylet was inserted into subcutaneous tissue in line with femur. Sharp stylet was withdrawn and *** pellets were placed into trocar well. Testopel pellets advanced into tissue using blunt ended stylet. Trocar removed and incision closed using *** Steri-Strips. Cleansed area to remove *** and covered Steri-Strips with outer Band-Aid.  Careful inspection of insertion is done and patient informed of post procedure instructions.  Advised patient to apply ice to the site for 20-30 minutes every hour if needed.  Avoid hot tubes, swimming or full water immersion of the insertion site for 72 hours.  Bandage may be removed after one week.    Patient is advised to contact the office if experiencing drainage of the insertion site, excessive redness or swelling of the site, chills and/or fevers > 101.5, nausea or vomiting, dizziness or lightheadedness and excessive tenderness.  Avoid strenuous activity and heavy lifting for 72 hours.     He will return in *** month for serum testosterone.

## 2022-07-15 ENCOUNTER — Ambulatory Visit: Payer: Medicare Other | Admitting: Urology

## 2022-07-15 ENCOUNTER — Encounter: Payer: Self-pay | Admitting: Urology

## 2022-07-15 VITALS — BP 118/75 | HR 102 | Ht 71.0 in | Wt 185.0 lb

## 2022-07-15 DIAGNOSIS — E291 Testicular hypofunction: Secondary | ICD-10-CM

## 2022-07-15 DIAGNOSIS — E349 Endocrine disorder, unspecified: Secondary | ICD-10-CM

## 2022-07-15 MED ORDER — TESTOSTERONE 75 MG IL PLLT
75.0000 mg | PELLET | Freq: Once | Status: AC
  Administered 2022-07-15: 75 mg

## 2022-07-21 ENCOUNTER — Other Ambulatory Visit: Payer: Self-pay | Admitting: Internal Medicine

## 2022-07-21 DIAGNOSIS — I2510 Atherosclerotic heart disease of native coronary artery without angina pectoris: Secondary | ICD-10-CM

## 2022-07-21 DIAGNOSIS — R0789 Other chest pain: Secondary | ICD-10-CM

## 2022-07-24 ENCOUNTER — Ambulatory Visit: Admit: 2022-07-24 | Payer: Medicare Other | Admitting: Gastroenterology

## 2022-07-24 SURGERY — COLONOSCOPY WITH PROPOFOL
Anesthesia: General

## 2022-08-13 ENCOUNTER — Other Ambulatory Visit: Payer: Self-pay | Admitting: *Deleted

## 2022-08-13 DIAGNOSIS — N401 Enlarged prostate with lower urinary tract symptoms: Secondary | ICD-10-CM

## 2022-08-13 DIAGNOSIS — E349 Endocrine disorder, unspecified: Secondary | ICD-10-CM

## 2022-08-13 DIAGNOSIS — E291 Testicular hypofunction: Secondary | ICD-10-CM

## 2022-08-14 ENCOUNTER — Other Ambulatory Visit: Payer: Medicare Other

## 2022-08-14 DIAGNOSIS — E349 Endocrine disorder, unspecified: Secondary | ICD-10-CM

## 2022-08-14 DIAGNOSIS — N138 Other obstructive and reflux uropathy: Secondary | ICD-10-CM

## 2022-08-14 DIAGNOSIS — E291 Testicular hypofunction: Secondary | ICD-10-CM

## 2022-08-15 LAB — HEMOGLOBIN AND HEMATOCRIT, BLOOD
Hematocrit: 39 % (ref 37.5–51.0)
Hemoglobin: 13.7 g/dL (ref 13.0–17.7)

## 2022-08-15 LAB — TESTOSTERONE: Testosterone: 779 ng/dL (ref 264–916)

## 2022-08-15 LAB — PSA: Prostate Specific Ag, Serum: 0.5 ng/mL (ref 0.0–4.0)

## 2022-08-25 ENCOUNTER — Telehealth (HOSPITAL_COMMUNITY): Payer: Self-pay | Admitting: Emergency Medicine

## 2022-08-25 NOTE — Telephone Encounter (Signed)
Pt wishes to cancel appt Rockwell Alexandria RN Navigator Cardiac Imaging [facility] [phone] Office  [phone] Cell

## 2022-08-27 ENCOUNTER — Ambulatory Visit: Admission: RE | Admit: 2022-08-27 | Payer: Medicare Other | Source: Ambulatory Visit

## 2022-08-31 NOTE — Progress Notes (Unsigned)
09/01/2022 10:54 AM   Jared Farley [birth year] [id number]  Referring provider: Enid Baas, MD [address] [place],  [place] [address]  Urological history: 1. Testosterone deficiency -contributing factors of age and chronic opioid use -testosterone (08/2022) 779 -hemoglobin/hematocrit (08/2022) 13.7/39.0  2. ED -contributing factors of age, COPD, smoking, testosterone deficiency, BPH -not sexually active   3. BPH with LU TS -PSA (08/2022) 0.5 -alfuzosin 10 mg daily  4. Chronic prostatitis  5. High risk hematuria -smoker > 30 pack year history -CTU (12/2021) no worrisome findings -cysto (02/27/2022) - NED  Chief Complaint  Patient presents with   Follow-up    HPI: Jared Farley is a 62 y.o. male for follow up for a 6 month follow up.    He underwent Testopel insertion on 07/15/2022.    I PSS 4/0  He has no complaints at this time.  Patient denies any modifying or aggravating factors.  Patient denies any recent UTI's, gross hematuria, dysuria or suprapubic/flank pain.  Patient denies any fevers, chills, nausea or vomiting.    IPSS     Row Name 09/01/22 1000         International Prostate Symptom Score   How often have you had the sensation of not emptying your bladder? Not at All     How often have you had to urinate less than every two hours? Less than 1 in 5 times     How often have you found you stopped and started again several times when you urinated? Not at All     How often have you found it difficult to postpone urination? Not at All     How often have you had a weak urinary stream? Less than 1 in 5 times     How often have you had to strain to start urination? Not at All     How many times did you typically get up at night to urinate? 2 Times     Total IPSS Score 4       Quality of Life due to urinary symptoms   If you were to spend the rest of your life with your urinary condition just the way it is now how would you feel about  that? Delighted               Score:  1-7 Mild 8-19 Moderate 20-35 Severe   PMH: Past Medical History:  Diagnosis Date   Anxiety    Chronic pain following surgery or procedure    testicular   COPD (chronic obstructive pulmonary disease) (HCC)    Coronary artery disease    Degenerative joint disease    Depression    Dyspnea    Pneumonia    Seizures (HCC)     Surgical History: Past Surgical History:  Procedure Laterality Date   ANKLE SURGERY     COLONOSCOPY W/ POLYPECTOMY     CYSTOSCOPY N/A 02/27/2022   Procedure: CYSTOSCOPY;  Surgeon: Sondra Come, MD;  Location: [facility] ORS;  Service: Urology;  Laterality: N/A;   dental work     NASAL FRACTURE SURGERY     x 3   VASECTOMY      Home Medications:  Allergies as of 09/01/2022       Reactions   Trazodone And Nefazodone         Medication List        Accurate as of September 01, 2022 10:54 AM. If you have any questions, ask your  nurse or doctor.          STOP taking these medications    Pirfenidone 267 MG Tabs       TAKE these medications    albuterol 108 (90 Base) MCG/ACT inhaler Commonly known as: VENTOLIN HFA Inhale into the lungs.   aspirin 81 MG tablet Take 81 mg by mouth daily. Last dose on 02/23/22 prior to procedure   dextromethorphan-guaiFENesin 30-600 MG 12hr tablet Commonly known as: MUCINEX DM Take 1 tablet by mouth as needed for cough.   ipratropium-albuterol 0.5-2.5 (3) MG/3ML Soln Commonly known as: DUONEB every 6 (six) hours as needed.   lidocaine 5 % Commonly known as: LIDODERM 1 patch as needed.   morphine 60 MG 12 hr tablet Commonly known as: MS CONTIN Take 60 mg by mouth 2 (two) times daily.   oxyCODONE 15 MG immediate release tablet Commonly known as: ROXICODONE 15 mg every 6 (six) hours as needed.   predniSONE 10 MG tablet Commonly known as: DELTASONE Take by mouth every other day.   silodosin 8 MG Caps capsule Commonly known as: Rapaflo Take 1 capsule (8  mg total) by mouth daily with breakfast.   Trelegy Ellipta 200-62.5-25 MCG/ACT Aepb Generic drug: Fluticasone-Umeclidin-Vilant Inhale 1 puff into the lungs daily.   triamcinolone cream 0.1 % Commonly known as: KENALOG Apply 1 application topically 2 (two) times daily. What changed: when to take this   VITAMIN D-3 PO Take by mouth.        Allergies:  Allergies  Allergen Reactions   Trazodone And Nefazodone     Family History: Family History  Problem Relation Age of Onset   Depression Mother    Depression Brother    Suicidality Brother     Social History:  reports that he has been smoking cigarettes. He has been smoking an average of 1 pack per day. He has never used smokeless tobacco. He reports that he does not currently use alcohol. He reports current drug use. Drug: Marijuana.  ROS: Pertinent ROS in HPI  Physical Exam: Blood pressure 126/78, pulse 96.  Constitutional:  Well nourished. Alert and oriented, No acute distress. HEENT: [place] AT, moist mucus membranes.  Trachea midline Cardiovascular: No clubbing, cyanosis, or edema. Respiratory: Normal respiratory effort, no increased work of breathing. Neurologic: Grossly intact, no focal deficits, moving all 4 extremities. Psychiatric: Normal mood and affect.   Laboratory Data: Results for orders placed or performed in visit on 08/14/22  Testosterone  Result Value Ref Range   Testosterone 779 264 - 916 ng/dL  PSA  Result Value Ref Range   Prostate Specific Ag, Serum 0.5 0.0 - 4.0 ng/mL  Hemoglobin and hematocrit, blood  Result Value Ref Range   Hemoglobin 13.7 13.0 - 17.7 g/dL   Hematocrit 16.1 09.6 - 51.0 %  I have reviewed the labs.   Pertinent Imaging: N/A  Assessment & Plan:    1. High risk hematuria -smoker -CTU (2023) - no worrisome findings -cysto (2023) - NED  -no reports of gross heme  2. Testosterone deficiency  -testosterone levels are therapeutic -H & H stable -he is not going to  continue testosterone replacement therapy at this time as he has insurmountable amount of medical bills I would like to get a grasp on those  3. BPH with LUTS -continue Rapaflo 8 mg daily   4. Erectile dysfunction:    -He states he has no libido and is not sexually active  5. Scrotal pain -managed through pain clinic  [place], [place] [address]  [place], [place] [address] [phone]

## 2022-09-01 ENCOUNTER — Ambulatory Visit: Payer: Medicare Other | Admitting: Urology

## 2022-09-01 ENCOUNTER — Encounter: Payer: Self-pay | Admitting: Urology

## 2022-09-01 VITALS — BP 126/78 | HR 96

## 2022-09-01 DIAGNOSIS — N529 Male erectile dysfunction, unspecified: Secondary | ICD-10-CM

## 2022-09-01 DIAGNOSIS — E291 Testicular hypofunction: Secondary | ICD-10-CM | POA: Diagnosis not present

## 2022-09-01 DIAGNOSIS — E349 Endocrine disorder, unspecified: Secondary | ICD-10-CM

## 2022-09-01 DIAGNOSIS — N401 Enlarged prostate with lower urinary tract symptoms: Secondary | ICD-10-CM

## 2022-09-01 DIAGNOSIS — N5082 Scrotal pain: Secondary | ICD-10-CM | POA: Diagnosis not present

## 2022-09-01 DIAGNOSIS — R319 Hematuria, unspecified: Secondary | ICD-10-CM

## 2023-04-06 NOTE — Progress Notes (Signed)
 Jared Farley is a 63 y.o. here for Medicare Wellness Visit and annual physical  MEDICARE WELLNESS VISIT Providers Rendering Care 1. Dr. Lavenia Beaver (PCP) 2.  New psychiatrist which she will be seeing next week 3.  New pain clinic  Functional Assessment  (1) Hearing: Demonstrates no difficulty in hearing during normal conversation (2) Risk of Falls: Patient denies any falls or near falls in the last year, gait is steady with his cane that he uses all the time. (3) Home Safety: Patient feels secure in their home, There are operational smoke alarms in multiple areas of the home (4) Activities of Daily Living: Independently manages personal grooming and household chores, including cooking, cleaning and laundry. Manages Personal finances without assistance.  PHQ 2/9 from today's flowsheet  PHQ-2 PHQ-2 Over the last 2 weeks, how often have you been bothered by any of the following problems? Little interest or pleasure in doing things: Nearly every day Feeling down, depressed, or hopeless: More than half the days Patient Health Questionnaire-2 Score: 5  PHQ-9 (if PHQ >=3) PHQ-9 Over the last 2 weeks, how often have you been bothered by any of the following problems? Trouble falling or staying asleep, or sleeping too much: Several days Feeling tired or having little energy: More than half the days Poor appetite or overeating: Several days Feeling bad about yourself - or that you are a failure or have let yourself or your family down: Not at all Trouble concentrating on things, such as reading the newspaper or watching television: Nearly every day Moving or speaking so slowly that other people could have noticed? Or the opposite - being so fidgety or restless that you have been moving around a lot more than usual.: Not at all Thoughts that you would be better off dead or hurting yourself in some way: More than half the days How difficult have these problems made it for you to do your  work, take care of things at home, or get along with other people?: Very difficult Patient Health Questionnaire-9 Score: 14  Depression Severity and Treatment Recommendations:  0-4= None  5-9= Mild / Treatment: Support, educate to call if worse; return in one month  10-14= Moderate / Treatment: Support, watchful waiting; Antidepressant or Psychotherapy  15-19= Moderately severe / Treatment: Antidepressant OR Psychotherapy  >= 20 = Major depression, severe / Antidepressant AND Psychotherapy   Depression Screening PHQ 2/9 last 3 flowsheet values    02/07/2021   10:30 AM 10/16/2021   10:44 AM 04/06/2023    2:52 PM  PHQ-2/9 Depression Screening   Little interest or pleasure in doing things   3  Feeling down, depressed, or hopeless   2  Patient Health Questionnaire-2 Score   5  Trouble falling or staying asleep, or sleeping too much   1  Feeling tired or having little energy   2  Poor appetite or overeating   1  Feeling bad about yourself - or that you are a failure or have let yourself or your family down   0  Trouble concentrating on things, such as reading the newspaper or watching television   3  Moving or speaking so slowly that other people could have noticed? Or the opposite - being so fidgety or restless that you have been moving around a lot more than usual.   0  Thoughts that you would be better off dead or hurting yourself in some way   2  How difficult have these problems made it for  you to do your work, take care of things at home, or get along with other people?   Very difficult  Patient Health Questionnaire-9 Score   14  (OBSOLETE) Little interest or pleasure in doing things 2 2   (OBSOLETE) Feeling down, depressed, or hopeless (or irritable for Teens only)? 2 0   (OBSOLETE) Total Prescreening Score 4 2   (OBSOLETE) Trouble falling or staying asleep, or sleeping too much? 3    (OBSOLETE) Feeling tired or having little energy? 3    (OBSOLETE) Poor appetite or overeating? 3     (OBSOLETE) Feeling bad about yourself - or that you are a failure or have let yourself or your family down? 1    (OBSOLETE) Trouble concentrating on things, such as reading the newspaper or watching television? 1    (OBSOLETE) Moving or speaking so slowly that other people could have noticed?  Or the opposite - being so fidgety or restless that you have been moving around a lot more than usual? 1    (OBSOLETE) Thoughts that you would be better off dead, or of hurting yourself in some way? 0    (OBSOLETE) How difficult have these problems made it for you do your work, take care of things at home, or get along with people? Somewhat difficult    (OBSOLETE) Total Score = 16 2     Depression Severity and Treatment Recommendations:  0-4= None  5-9= Mild / Treatment: Support, educate to call if worse; return in one month  10-14= Moderate / Treatment: Support, watchful waiting; Antidepressant or Psychotherapy  15-19= Moderately severe / Treatment: Antidepressant OR Psychotherapy  >= 20 = Major depression, severe / Antidepressant AND Psychotherapy   Cognitive Impairment Patient denies episodes of loosing things, being forgetful. Seems oriented to person, place and time. Responses appear appropriate and timely to this observer.    * No data to display           PREVENTION PLAN Cardiovascular:  Lab Results  Component Value Date   LDLCALC 108 10/16/2021   Diabetes:  Lab Results  Component Value Date   HGBA1C 5.9 (H) 10/16/2021   HGBA1C 5.7 (H) 02/07/2021   Glaucoma: UTD  Smoking Cessation:  Not Applicable  Other Personalized Health Advice Encouraged patient to exercise 5 days a week, walking, water  aerobics, gentle stretching recommended. Increase dietary intake of fresh fruits and vegetables, reduce red meat to twice a week.   Goals     .  Follow my doctor's care plan    . * Quit smoking / using tobacco (pt-stated)    . * Reduce Blood Pressure (pt-stated)        End of Life  Counseling Patient has living will in place; POA - ; Full Code  Current Outpatient Medications  Medication Sig Dispense Refill  . albuterol  MDI, PROVENTIL , VENTOLIN , PROAIR , HFA 90 mcg/actuation inhaler Inhale 2 inhalations into the lungs every 4 (four) hours as needed 8.5 g 1  . aspirin 81 MG EC tablet Take 81 mg by mouth once daily    . cholecalciferol, vitamin D3, (CHOLECALCIFEROL, VIT D3,,BULK,) 100,000 unit/gram Powd Take 1,000 Units by mouth once daily    . fluticasone-umeclidinium-vilanterol (TRELEGY ELLIPTA) 200-62.5-25 mcg inhaler Inhale 1 Puff into the lungs once daily 60 each 11  . ipratropium-albuteroL  (DUO-NEB) nebulizer solution Take 3 mLs by nebulization 4 (four) times daily as needed    . lidocaine  (LIDODERM ) 5 % patch     . morphine (MS CONTIN) 60 MG  ER tablet 60 mg every 12 (twelve) hours    . oxyCODONE  (ROXICODONE ) 15 MG immediate release tablet Take 1 tablet (15 mg total) by mouth every 6 (six) hours as needed    . predniSONE  (DELTASONE ) 10 MG tablet 10 mg every other day    . silodosin  (RAPAFLO ) 8 mg capsule Take 1 capsule (8 mg total) by mouth daily with breakfast 30 capsule 11  . testosterone  (ANDROGEL ) 1.62 % (20.25 mg/1.25 gram) gel packet Place onto the skin    . triamcinolone  0.1 % ointment Apply topically 2 (two) times daily     No current facility-administered medications for this visit.    Allergies as of 04/06/2023 - Reviewed 04/06/2023  Allergen Reaction Noted  . Trazodone hcl Unknown 08/28/2014    Patient Active Problem List  Diagnosis  . Chronic pain  . COPD (chronic obstructive pulmonary disease) (CMS/HHS-HCC)  . Encounter for long-term (current) use of high-risk medication  . Testicular hypofunction  . Temperature intolerance  . Retention of urine, unspecified  . PTSD (post-traumatic stress disorder)  . Prostatitis, chronic  . Orchalgia  . Insomnia  . Benign localized hyperplasia of prostate with urinary obstruction  . Acquired trigger finger  of left middle finger  . Sprain, finger    Past Medical History:  Diagnosis Date  . Anxiety   . Chronic pain syndrome   . COPD (chronic obstructive pulmonary disease) (CMS/HHS-HCC)   . Depression   . H/O idiopathic seizure   . History of chicken pox   . Pulmonary fibrosis (CMS/HHS-HCC)     Past Surgical History:  Procedure Laterality Date  . COLONOSCOPY N/A 2011   PH Polyps per pt.  . EPIDIDYMECTOMY BILATERAL    . left ankle surgery    . RHINOPLASTY Bilateral    x3  . VASECTOMY    . wisdom teeth      Health Maintenance  Topic Date Due  . HIV Screen  Never done  . Hepatitis C Screen  Never done  . Colorectal Cancer Screening  Never done  . COVID-19 Vaccine (1) Never done  . Shingrix (1 of 2) Never done  . RSV Immunization Pregnant or 60+ (1 - Risk 60-74 years 1-dose series) Never done  . Medicare Initial or AWV  10/17/2022  . Influenza Vaccine (1) 11/01/2022  . Adult Tetanus (Td And Tdap)  03/02/2026 (Originally [birth year]/1979)  . PSA  10/17/2023  . Depression Screening  04/05/2024  . Lipid Panel  10/17/2026  . Pneumococcal Vaccine: 50+  Completed  . Hib Vaccines  Aged Out  . Hepatitis A Vaccines  Aged Out  . Meningococcal B Vaccine  Aged Out  . Meningococcal ACWY Vaccine  Aged Out  . HPV Vaccines  Aged Out    Social History:  reports that he has been smoking cigarettes. He has never used smokeless tobacco. He reports that he does not currently use alcohol. He reports that he does not use drugs.  Vitals:   04/06/23 1453  BP: 112/68  Pulse: 77  SpO2: 96%  Weight: 79.8 kg (176 lb)  Height: 175.3 cm (5' 9)  PainSc:   2  PainLoc: Back   Body mass index is 25.99 kg/m.   Goals     .  Follow my doctor's care plan    . * Quit smoking / using tobacco (pt-stated)    . * Reduce Blood Pressure (pt-stated)         Subjective:  History of Present Illness  -Patient lives at  home with his brother and a friend Ozell Lever.  They each share different parts of  the house and are independent and do their own chores.  He is agreeable to reaching out to either his brother or Mr. Delois for any concerns regarding the patient. -Patient's brother himself has some depression and psychosis and narcolepsy issues. -He has history of major depression, PTSD, insomnia and traumatic brain injury leading to convulsions.  Patient has had psychiatrist in the past but will be seeing a new psychiatrist Dr. Arloa in [place] next week as a new patient. -Has chronic pain issues secondary to severe spinal stenosis, degenerative disc disease, testicular nerve damage with chronic testicular pain.  He was seeing a pain provider and has been on MS Contin and oxycodone  for a really really long time.  His pain medication provider recently retired and he is in the process of getting to a pain clinic where they can manage his pain. -[place] and has been smoking for long time.  Currently continues to smoke. -Struggles with hypersomnia sometimes.  Has been forgetting his appointments recently. -. He reports occasional nausea, which he manages with cannabis. He also reports pain in his shoulders, particularly when lifting his arms, and in his right knee when extending his leg. He uses a cane for mobility and has not had any falls recently.    ROS:  General: No fever, chills or recent illness. No change in weight, fatigue Skin:   No skin lesions, growths, masses, rashes, pruritus  HEENT: No change in vision or hearing. No pain or difficulty with swallowing Respiratory: No cough or shortness of breath CV:  No chest pain or palpitations GI:  No pain, dyspepsia or change in bowel habits GU:  No dysuria, frequency, or hesitancy MSK:  Chronic low back pain, neck stiffness, bilateral shoulder pain and also right knee pain Neurological: No headaches, changes in mental status, loss of sensation or strength  Endocrine:  No heat or cold intolerance, polydipsia,  polyuria Psychological: Does have anxiety and hypersomnia issues and depression  Objective:   Body mass index is 25.99 kg/m.  BP 112/68   Pulse 77   Ht 175.3 cm (5' 9)   Wt 79.8 kg (176 lb)   SpO2 96%   BMI 25.99 kg/m   General: WD/WN male, in no acute distress HEENT: Pupils equal and round, EOMI.  OP moist without lesions Neck: supple, trachea midline; no thyromegaly Chest: normal to palpation Lungs:clear to auscultation without wheeze or retraction Cardiac:  Regular rate and rhythm without murmur, gallops, or rubs Vascular: No carotid bruit and radials 2+; distal pulses 2+ Abdominal:soft, nontender, positive bowel sounds.  No guarding or rebound tenderness Extremities:  No clubbing, cyanosis or edema, decreased range of motion of the shoulders and also right knee pain.  Severe low back pain unable to do any straight leg raising test. Neuro: CN grossly intact.  Gait intact.  No acute motor or sensory deficits Dermatologic: no significant rashes or nodules Lymph: no cervical or supraclavicular lymphadenopathy   Assessment/Plan:   Medicare annual wellness visit, subsequent  (primary encounter diagnosis) Annual physical exam Chronic pain syndrome PTSD (post-traumatic stress disorder) Recurrent major depressive disorder, in remission (CMS-HCC) Primary insomnia Other emphysema (CMS/HHS-HCC) Screening for prostate cancer Encounter for long-term (current) use of medications Hyperglycemia  Assessment and Plan  Assessment & Plan   1. Medicare wellness visit-  - Medications and allergies reviewed. Copy of preventative health provided. -  Labs ordered.  He does not want to get them today. -Briefly addressed health maintenance -Discussed past history, current concerns. -Living situation, diet and activity discussed.. -Encouraged to get his shingles and pneumonia vaccines.     2. Chronic Pain syndrome- -In the process of finding new pain clinic.  Has an appointment with  [facility] next week. -He reached out to [facility] pain management and another pain clinic in [place] and have not heard back from them. -He has been on MS Contin and also oxycodone  for breakthrough.  Also takes cannabis for chronic nausea symptoms.  3. Shoulder Pain Bilateral shoulder pain with limited range of motion, worse on the right side. Pain localized to the glenohumeral joint. -Chronic arthritis.  Monitor for now.  4.  COPD- -Ongoing smoking. -Continue inhalers.  Patient not ready to quit at this time.  5.  Depression with prior psychosis history- -Will be seeing a new psychiatrist next week. -Currently not on any medications at this time.  Has been off of them for a really long time    Follow-up with me in 6 months for follow-up of depression and anxiety, smoking and hypersomnia.  Sooner for acute issues    LAVENIA BEAVER, MD   *Some images could not be shown.

## 2023-09-14 ENCOUNTER — Ambulatory Visit: Admitting: Urology

## 2023-09-14 VITALS — BP 163/82 | HR 125 | Ht 71.0 in | Wt 184.0 lb

## 2023-09-14 DIAGNOSIS — N5082 Scrotal pain: Secondary | ICD-10-CM

## 2023-09-14 NOTE — Progress Notes (Signed)
   09/14/2023 2:08 PM   Jared Farley 10/16/1960 [id number]  Reason for visit: Bilateral scrotal pain  HPI: 63 year old male who has been on extremely high-dose narcotics long-term for chronic bilateral testicular pain.  He states this started after he had a vasectomy in his 68s.  It seems to be worse on the left than the right.  He had an epididymectomy on the left side which reportedly made his symptoms worse.  His sensation is a perpetual kicked in the groin feeling.  He is interested in more definitive treatment options then high dose pain medications.  Notably, he reportedly had a spermatic cord block done by a pain clinic which completely resolved his symptoms for 3 days.  He has been followed by Jared Cornwall, PA long-term for BPH and low testosterone  as well, can continue to see her yearly.  We discussed the concept of spermatic cord denervation, and that I do think he would benefit significantly from this.  Referral placed to Dr. Lovie in [place] for consideration  Jared JAYSON Burnet, MD  [facility] [address], [address] [place], [place] [address] [phone]

## 2023-09-14 NOTE — Patient Instructions (Addendum)
 [email or web address]  A referral was placed to Dr. Lovie with [facility] in [place] to consider spermatic cord denervation, this is a procedure that could potentially significantly help with your chronic scrotal pain

## 2023-10-20 ENCOUNTER — Other Ambulatory Visit: Payer: Self-pay | Admitting: Specialist

## 2023-10-20 DIAGNOSIS — F17210 Nicotine dependence, cigarettes, uncomplicated: Secondary | ICD-10-CM

## 2023-11-02 ENCOUNTER — Ambulatory Visit
Admission: RE | Admit: 2023-11-02 | Discharge: 2023-11-02 | Disposition: A | Discharge status: Home / Self Care | Source: Ambulatory Visit | Attending: Specialist | Admitting: Specialist

## 2023-11-02 DIAGNOSIS — F17210 Nicotine dependence, cigarettes, uncomplicated: Secondary | ICD-10-CM | POA: Diagnosis present

## 2023-11-15 NOTE — Telephone Encounter (Signed)
 Patient states that he saw Dr. Sherial back on 9/5 and sending referral to Pain Management and her not been contacted by anyone yet.   Please advise.

## 2024-02-22 ENCOUNTER — Emergency Department: Admission: EM | Admit: 2024-02-22 | Discharge: 2024-02-22 | Disposition: A | Discharge status: Home / Self Care

## 2024-02-22 ENCOUNTER — Emergency Department

## 2024-02-22 ENCOUNTER — Encounter: Payer: Self-pay | Admitting: Emergency Medicine

## 2024-02-22 ENCOUNTER — Other Ambulatory Visit: Payer: Self-pay

## 2024-02-22 DIAGNOSIS — J189 Pneumonia, unspecified organism: Secondary | ICD-10-CM

## 2024-02-22 DIAGNOSIS — J181 Lobar pneumonia, unspecified organism: Secondary | ICD-10-CM | POA: Diagnosis not present

## 2024-02-22 DIAGNOSIS — J441 Chronic obstructive pulmonary disease with (acute) exacerbation: Secondary | ICD-10-CM | POA: Insufficient documentation

## 2024-02-22 DIAGNOSIS — R059 Cough, unspecified: Secondary | ICD-10-CM | POA: Diagnosis present

## 2024-02-22 LAB — COMPREHENSIVE METABOLIC PANEL WITH GFR
ALT: 16 U/L (ref 0–44)
AST: 36 U/L (ref 15–41)
Albumin: 4.1 g/dL (ref 3.5–5.0)
Alkaline Phosphatase: 60 U/L (ref 38–126)
Anion gap: 13 (ref 5–15)
BUN: 15 mg/dL (ref 8–23)
CO2: 23 mmol/L (ref 22–32)
Calcium: 8.8 mg/dL — ABNORMAL LOW (ref 8.9–10.3)
Chloride: 99 mmol/L (ref 98–111)
Creatinine, Ser: 0.94 mg/dL (ref 0.61–1.24)
GFR, Estimated: >60 mL/min
Glucose, Bld: 119 mg/dL — ABNORMAL HIGH (ref 70–99)
Potassium: 3.9 mmol/L (ref 3.5–5.1)
Sodium: 135 mmol/L (ref 135–145)
Total Bilirubin: 0.6 mg/dL (ref 0.0–1.2)
Total Protein: 7.8 g/dL (ref 6.5–8.1)

## 2024-02-22 LAB — CBC WITH DIFFERENTIAL/PLATELET
Abs Immature Granulocytes: 0.02 K/uL (ref 0.00–0.07)
Basophils Absolute: 0 K/uL (ref 0.0–0.1)
Basophils Relative: 0 %
Eosinophils Absolute: 0 K/uL (ref 0.0–0.5)
Eosinophils Relative: 0 %
HCT: 37.4 % — ABNORMAL LOW (ref 39.0–52.0)
Hemoglobin: 12.9 g/dL — ABNORMAL LOW (ref 13.0–17.0)
Immature Granulocytes: 0 %
Lymphocytes Relative: 15 %
Lymphs Abs: 1.2 K/uL (ref 0.7–4.0)
MCH: 32.3 pg (ref 26.0–34.0)
MCHC: 34.5 g/dL (ref 30.0–36.0)
MCV: 93.7 fL (ref 80.0–100.0)
Monocytes Absolute: 0.9 K/uL (ref 0.1–1.0)
Monocytes Relative: 10 %
Neutro Abs: 6.2 K/uL (ref 1.7–7.7)
Neutrophils Relative %: 75 %
Platelets: 212 K/uL (ref 150–400)
RBC: 3.99 MIL/uL — ABNORMAL LOW (ref 4.22–5.81)
RDW: 13.3 % (ref 11.5–15.5)
WBC: 8.3 K/uL (ref 4.0–10.5)
nRBC: 0 % (ref 0.0–0.2)

## 2024-02-22 LAB — RESP PANEL BY RT-PCR (RSV, FLU A&B, COVID)  RVPGX2
Influenza A by PCR: NEGATIVE
Influenza B by PCR: NEGATIVE
Resp Syncytial Virus by PCR: NEGATIVE
SARS Coronavirus 2 by RT PCR: NEGATIVE

## 2024-02-22 LAB — LACTIC ACID, PLASMA
Lactic Acid, Venous: 1.2 mmol/L (ref 0.5–1.9)
Lactic Acid, Venous: 1.7 mmol/L (ref 0.5–1.9)

## 2024-02-22 MED ORDER — IPRATROPIUM-ALBUTEROL 0.5-2.5 (3) MG/3ML IN SOLN
3.0000 mL | Freq: Once | RESPIRATORY_TRACT | Status: AC
  Administered 2024-02-22: 3 mL via RESPIRATORY_TRACT
  Filled 2024-02-22: qty 3

## 2024-02-22 MED ORDER — AMOXICILLIN-POT CLAVULANATE 875-125 MG PO TABS
1.0000 | ORAL_TABLET | Freq: Once | ORAL | Status: AC
  Administered 2024-02-22: 1 via ORAL
  Filled 2024-02-22: qty 1

## 2024-02-22 MED ORDER — AMOXICILLIN-POT CLAVULANATE 875-125 MG PO TABS: 1.0000 | ORAL_TABLET | Freq: Two times a day (BID) | ORAL | 0 refills | Status: DC

## 2024-02-22 MED ORDER — PREDNISONE 10 MG PO TABS: 50.0000 mg | ORAL_TABLET | Freq: Every day | ORAL | 0 refills | Status: AC

## 2024-02-22 MED ORDER — BENZONATATE 100 MG PO CAPS: 100.0000 mg | ORAL_CAPSULE | Freq: Two times a day (BID) | ORAL | 0 refills | Status: AC

## 2024-02-22 MED ORDER — PREDNISONE 20 MG PO TABS
60.0000 mg | ORAL_TABLET | Freq: Once | ORAL | Status: AC
  Administered 2024-02-22: 60 mg via ORAL
  Filled 2024-02-22: qty 3

## 2024-02-22 NOTE — ED Provider Notes (Signed)
 "  [facility] Provider Note    Event Date/Time   First MD Initiated Contact with Patient 02/22/24 1318     (approximate)   History   Cough and Shortness of Breath   HPI  Jared Farley is a 63 y.o. male presenting with concern of cough and shortness of breath.  Symptoms ongoing for the last few days, primarily cough that seems to be worsening and causing some chest wall pain now because of how much he is coughing.  Denies any fevers or chills at home.  He has underlying history of COPD he is supposed to use 2 L of nasal cannula at home, but he has not been as he generally does not require this and he tells me that some of the equipment does not work or is broken now.  He believes that he may have run into someone's sick while he was outdoors recently.  Has been using his inhaler and nebulizer at home has been giving him some relief of symptoms.     Physical Exam   Triage Vital Signs: ED Triage Vitals  Encounter Vitals Group     BP 02/22/24 1039 130/67     Girls Systolic BP Percentile --      Girls Diastolic BP Percentile --      Boys Systolic BP Percentile --      Boys Diastolic BP Percentile --      Pulse Rate 02/22/24 1039 95     Resp 02/22/24 1039 18     Temp 02/22/24 1039 98.4 F (36.9 C)     Temp Source 02/22/24 1039 Oral     SpO2 02/22/24 1039 94 %     Weight 02/22/24 1032 175 lb (79.4 kg)     Height 02/22/24 1032 6' (1.829 m)     Head Circumference --      Peak Flow --      Pain Score 02/22/24 1032 0     Pain Loc --      Pain Education --      Exclude from Growth Chart --     Most recent vital signs: Vitals:   02/22/24 1436 02/22/24 1446  BP: 114/75   Pulse: 93   Resp: (!) 22   Temp: 99.4 F (37.4 C)   SpO2: 93% 98%     General: Awake, no distress.  CV:  Good peripheral perfusion.  Resp:  Normal effort.  Able to speak in full sentences, some faint wheezes are appreciated to bilateral lower lobes Abd:  No distention. Soft  nontender Other:     ED Results / Procedures / Treatments   Labs (all labs ordered are listed, but only abnormal results are displayed) Labs Reviewed  COMPREHENSIVE METABOLIC PANEL WITH GFR - Abnormal; Notable for the following components:      Result Value   Glucose, Bld 119 (*)    Calcium 8.8 (*)    All other components within normal limits  CBC WITH DIFFERENTIAL/PLATELET - Abnormal; Notable for the following components:   RBC 3.99 (*)    Hemoglobin 12.9 (*)    HCT 37.4 (*)    All other components within normal limits  RESP PANEL BY RT-PCR (RSV, FLU A&B, COVID)  [id number]  LACTIC ACID, PLASMA  LACTIC ACID, PLASMA     EKG  Sinus rhythm with rate of about 95, axis of 75, intervals appear to be within normal limits, no obvious ischemia and I appreciate this EKG   RADIOLOGY  On my independent interpretation of this EKG, questionable consolidation appreciated in the left lateral pulmonary field  PROCEDURES:  Critical Care performed: No  Procedures   MEDICATIONS ORDERED IN ED: Medications  ipratropium-albuterol  (DUONEB) 0.5-2.5 (3) MG/3ML nebulizer solution 3 mL (3 mLs Nebulization Given 02/22/24 1438)  predniSONE  (DELTASONE ) tablet 60 mg (60 mg Oral Given 02/22/24 1437)  amoxicillin -clavulanate (AUGMENTIN ) 875-125 MG per tablet 1 tablet (1 tablet Oral Given 02/22/24 1437)     IMPRESSION / MDM / ASSESSMENT AND PLAN / ED COURSE  I reviewed the triage vital signs and the nursing notes.                               Patient's presentation is most consistent with acute complicated illness / injury requiring diagnostic workup.  63 year old male presents today with concern of cough fatigue shortness of breath.  He appears well he is not in any acute distress, nursing staff had placed him on 2 L nasal cannula, but while I am in the room I am able to wean him off this and he is able to speak in full sentences without evidence of oxygen desaturation.  He clinically appears  well he is not in any acute distress.  Will give him a nebulizer here as well as steroids and treat for COPD exacerbation, x-ray appears to demonstrate questionable evidence of a pneumonia, we will go ahead and empirically treat with antibiotics.  I believe reasonable for discharge home given the clinical picture and exam.  I discussed with the patient and he is amenable with the plan.  I discussed return precautions.       FINAL CLINICAL IMPRESSION(S) / ED DIAGNOSES   Final diagnoses:  COPD exacerbation (HCC)  Community acquired pneumonia of left lower lobe of lung     Rx / DC Orders   ED Discharge Orders          Ordered    predniSONE  (DELTASONE ) 10 MG tablet  Daily        02/22/24 1515    benzonatate  (TESSALON ) 100 MG capsule  2 times daily        02/22/24 1515    amoxicillin -clavulanate (AUGMENTIN ) 875-125 MG tablet  2 times daily        02/22/24 1515             Note:  This document was prepared using Dragon voice recognition software and may include unintentional dictation errors.   Fernand Rossie HERO, MD 02/22/24 [id number]  "

## 2024-02-22 NOTE — ED Triage Notes (Addendum)
 First Nurse Note;  Pt via [facility] from home. Pt c/o cough, SOB, and pain when he cough since Saturday. Pt has a hx of COPD. Suppose to wear 2L [place] chronically states he does not wear like he should. Pt is A&Ox4 and NAD EMS reports 98.9 orally, 124 CBG, 100 HR, 121/83, 96% on RA, 22 RR.

## 2024-02-22 NOTE — Discharge Instructions (Signed)
 You were seen today due to concern of cough and shortness of breath.  It seems you likely have a pneumonia, I started you on some antibiotics, please take these as instructed.  I have also written for some steroids for you to take, please take these as instructed.  If you have any worsening symptoms such as chest pain, increased shortness of breath, or any other symptoms you find concerning please return to the emergency department immediately for further medical management.

## 2024-02-25 NOTE — Progress Notes (Signed)
Patient apparently left without being seen

## 2024-02-28 ENCOUNTER — Encounter: Payer: Self-pay | Admitting: Medical Oncology

## 2024-02-28 ENCOUNTER — Inpatient Hospital Stay
Admission: EM | Admit: 2024-02-28 | Discharge: 2024-03-01 | DRG: 871 | Disposition: A | Discharge status: Home Health | Attending: Internal Medicine | Admitting: Internal Medicine

## 2024-02-28 ENCOUNTER — Other Ambulatory Visit: Payer: Self-pay

## 2024-02-28 ENCOUNTER — Emergency Department

## 2024-02-28 DIAGNOSIS — J101 Influenza due to other identified influenza virus with other respiratory manifestations: Secondary | ICD-10-CM

## 2024-02-28 DIAGNOSIS — F419 Anxiety disorder, unspecified: Secondary | ICD-10-CM | POA: Diagnosis present

## 2024-02-28 DIAGNOSIS — Z79891 Long term (current) use of opiate analgesic: Secondary | ICD-10-CM | POA: Diagnosis not present

## 2024-02-28 DIAGNOSIS — Z7952 Long term (current) use of systemic steroids: Secondary | ICD-10-CM | POA: Diagnosis not present

## 2024-02-28 DIAGNOSIS — F32A Depression, unspecified: Secondary | ICD-10-CM | POA: Diagnosis present

## 2024-02-28 DIAGNOSIS — F17210 Nicotine dependence, cigarettes, uncomplicated: Secondary | ICD-10-CM | POA: Diagnosis present

## 2024-02-28 DIAGNOSIS — J9621 Acute and chronic respiratory failure with hypoxia: Secondary | ICD-10-CM | POA: Diagnosis present

## 2024-02-28 DIAGNOSIS — F17200 Nicotine dependence, unspecified, uncomplicated: Secondary | ICD-10-CM | POA: Insufficient documentation

## 2024-02-28 DIAGNOSIS — A419 Sepsis, unspecified organism: Secondary | ICD-10-CM | POA: Diagnosis present

## 2024-02-28 DIAGNOSIS — N400 Enlarged prostate without lower urinary tract symptoms: Secondary | ICD-10-CM | POA: Diagnosis present

## 2024-02-28 DIAGNOSIS — J111 Influenza due to unidentified influenza virus with other respiratory manifestations: Secondary | ICD-10-CM

## 2024-02-28 DIAGNOSIS — F4310 Post-traumatic stress disorder, unspecified: Secondary | ICD-10-CM | POA: Diagnosis present

## 2024-02-28 DIAGNOSIS — J1000 Influenza due to other identified influenza virus with unspecified type of pneumonia: Secondary | ICD-10-CM | POA: Diagnosis present

## 2024-02-28 DIAGNOSIS — N401 Enlarged prostate with lower urinary tract symptoms: Secondary | ICD-10-CM

## 2024-02-28 DIAGNOSIS — J440 Chronic obstructive pulmonary disease with acute lower respiratory infection: Secondary | ICD-10-CM | POA: Diagnosis present

## 2024-02-28 DIAGNOSIS — T380X5A Adverse effect of glucocorticoids and synthetic analogues, initial encounter: Secondary | ICD-10-CM | POA: Diagnosis present

## 2024-02-28 DIAGNOSIS — I2510 Atherosclerotic heart disease of native coronary artery without angina pectoris: Secondary | ICD-10-CM | POA: Diagnosis present

## 2024-02-28 DIAGNOSIS — R0602 Shortness of breath: Secondary | ICD-10-CM

## 2024-02-28 DIAGNOSIS — J441 Chronic obstructive pulmonary disease with (acute) exacerbation: Secondary | ICD-10-CM | POA: Diagnosis present

## 2024-02-28 DIAGNOSIS — R0902 Hypoxemia: Secondary | ICD-10-CM

## 2024-02-28 DIAGNOSIS — J189 Pneumonia, unspecified organism: Secondary | ICD-10-CM

## 2024-02-28 DIAGNOSIS — E871 Hypo-osmolality and hyponatremia: Secondary | ICD-10-CM | POA: Diagnosis present

## 2024-02-28 DIAGNOSIS — Z7982 Long term (current) use of aspirin: Secondary | ICD-10-CM | POA: Diagnosis not present

## 2024-02-28 DIAGNOSIS — N138 Other obstructive and reflux uropathy: Secondary | ICD-10-CM

## 2024-02-28 DIAGNOSIS — G894 Chronic pain syndrome: Secondary | ICD-10-CM | POA: Diagnosis present

## 2024-02-28 DIAGNOSIS — A4189 Other specified sepsis: Secondary | ICD-10-CM | POA: Diagnosis present

## 2024-02-28 LAB — COMPREHENSIVE METABOLIC PANEL WITH GFR
ALT: 22 U/L (ref 0–44)
AST: 26 U/L (ref 15–41)
Albumin: 3.7 g/dL (ref 3.5–5.0)
Alkaline Phosphatase: 47 U/L (ref 38–126)
Anion gap: 10 (ref 5–15)
BUN: 8 mg/dL (ref 8–23)
CO2: 26 mmol/L (ref 22–32)
Calcium: 8.4 mg/dL — ABNORMAL LOW (ref 8.9–10.3)
Chloride: 95 mmol/L — ABNORMAL LOW (ref 98–111)
Creatinine, Ser: 0.78 mg/dL (ref 0.61–1.24)
GFR, Estimated: >60 mL/min
Glucose, Bld: 119 mg/dL — ABNORMAL HIGH (ref 70–99)
Potassium: 3.5 mmol/L (ref 3.5–5.1)
Sodium: 131 mmol/L — ABNORMAL LOW (ref 135–145)
Total Bilirubin: 0.8 mg/dL (ref 0.0–1.2)
Total Protein: 7.2 g/dL (ref 6.5–8.1)

## 2024-02-28 LAB — RESP PANEL BY RT-PCR (RSV, FLU A&B, COVID)  RVPGX2
Influenza A by PCR: POSITIVE — AB
Influenza B by PCR: NEGATIVE
Resp Syncytial Virus by PCR: NEGATIVE
SARS Coronavirus 2 by RT PCR: NEGATIVE

## 2024-02-28 LAB — BLOOD GAS, VENOUS
Acid-Base Excess: 6.6 mmol/L — ABNORMAL HIGH (ref 0.0–2.0)
Bicarbonate: 31.3 mmol/L — ABNORMAL HIGH (ref 20.0–28.0)
O2 Saturation: 78.2 %
Patient temperature: 37
pCO2, Ven: 44 mmHg (ref 44–60)
pH, Ven: 7.46 — ABNORMAL HIGH (ref 7.25–7.43)
pO2, Ven: 45 mmHg (ref 32–45)

## 2024-02-28 LAB — TROPONIN T, HIGH SENSITIVITY
Troponin T High Sensitivity: <15 ng/L (ref 0–19)
Troponin T High Sensitivity: <15 ng/L (ref 0–19)

## 2024-02-28 LAB — URINALYSIS, COMPLETE (UACMP) WITH MICROSCOPIC
Bacteria, UA: NONE SEEN
Bilirubin Urine: NEGATIVE
Glucose, UA: NEGATIVE mg/dL
Hgb urine dipstick: NEGATIVE
Ketones, ur: NEGATIVE mg/dL
Leukocytes,Ua: NEGATIVE
Nitrite: NEGATIVE
Protein, ur: NEGATIVE mg/dL
Specific Gravity, Urine: 1.002 — ABNORMAL LOW (ref 1.005–1.030)
Squamous Epithelial / HPF: 0 /HPF (ref 0–5)
pH: 6 (ref 5.0–8.0)

## 2024-02-28 LAB — LACTIC ACID, PLASMA
Lactic Acid, Venous: 1 mmol/L (ref 0.5–1.9)
Lactic Acid, Venous: 1.7 mmol/L (ref 0.5–1.9)

## 2024-02-28 LAB — CBC WITH DIFFERENTIAL/PLATELET
Abs Immature Granulocytes: 0.13 K/uL — ABNORMAL HIGH (ref 0.00–0.07)
Basophils Absolute: 0 K/uL (ref 0.0–0.1)
Basophils Relative: 0 %
Eosinophils Absolute: 0 K/uL (ref 0.0–0.5)
Eosinophils Relative: 0 %
HCT: 35.8 % — ABNORMAL LOW (ref 39.0–52.0)
Hemoglobin: 12.1 g/dL — ABNORMAL LOW (ref 13.0–17.0)
Immature Granulocytes: 1 %
Lymphocytes Relative: 20 %
Lymphs Abs: 3.4 K/uL (ref 0.7–4.0)
MCH: 31.7 pg (ref 26.0–34.0)
MCHC: 33.8 g/dL (ref 30.0–36.0)
MCV: 93.7 fL (ref 80.0–100.0)
Monocytes Absolute: 1.2 K/uL — ABNORMAL HIGH (ref 0.1–1.0)
Monocytes Relative: 7 %
Neutro Abs: 12.8 K/uL — ABNORMAL HIGH (ref 1.7–7.7)
Neutrophils Relative %: 72 %
Platelets: 229 K/uL (ref 150–400)
RBC: 3.82 MIL/uL — ABNORMAL LOW (ref 4.22–5.81)
RDW: 13.2 % (ref 11.5–15.5)
WBC: 17.6 K/uL — ABNORMAL HIGH (ref 4.0–10.5)
nRBC: 0 % (ref 0.0–0.2)

## 2024-02-28 LAB — PROCALCITONIN: Procalcitonin: <0.1 ng/mL

## 2024-02-28 LAB — PRO BRAIN NATRIURETIC PEPTIDE: Pro Brain Natriuretic Peptide: 261 pg/mL

## 2024-02-28 LAB — HIV ANTIBODY (ROUTINE TESTING W REFLEX): HIV Screen 4th Generation wRfx: NONREACTIVE

## 2024-02-28 MED ORDER — IPRATROPIUM-ALBUTEROL 0.5-2.5 (3) MG/3ML IN SOLN
3.0000 mL | Freq: Two times a day (BID) | RESPIRATORY_TRACT | Status: DC
  Administered 2024-02-29 – 2024-03-01 (×3): 3 mL via RESPIRATORY_TRACT
  Filled 2024-02-28 (×3): qty 3

## 2024-02-28 MED ORDER — INFLUENZA VIRUS VACC SPLIT PF (FLUZONE) 0.5 ML IM SUSY: 0.5000 mL | PREFILLED_SYRINGE | INTRAMUSCULAR | Status: DC

## 2024-02-28 MED ORDER — MAGNESIUM SULFATE 2 GM/50ML IV SOLN
2.0000 g | Freq: Once | INTRAVENOUS | Status: AC
  Administered 2024-02-28: 2 g via INTRAVENOUS
  Filled 2024-02-28: qty 50

## 2024-02-28 MED ORDER — TRIAMCINOLONE ACETONIDE 0.1 % EX CREA
1.0000 | TOPICAL_CREAM | Freq: Every day | CUTANEOUS | Status: DC
  Filled 2024-02-28: qty 15

## 2024-02-28 MED ORDER — VANCOMYCIN HCL IN DEXTROSE 1-5 GM/200ML-% IV SOLN
1000.0000 mg | Freq: Once | INTRAVENOUS | Status: AC
  Administered 2024-02-28: 1000 mg via INTRAVENOUS
  Filled 2024-02-28: qty 200

## 2024-02-28 MED ORDER — BUDESON-GLYCOPYRROL-FORMOTEROL 160-9-4.8 MCG/ACT IN AERO
2.0000 | INHALATION_SPRAY | Freq: Two times a day (BID) | RESPIRATORY_TRACT | Status: DC
  Administered 2024-02-28 – 2024-03-01 (×4): 2 via RESPIRATORY_TRACT
  Filled 2024-02-28: qty 5.9

## 2024-02-28 MED ORDER — SODIUM CHLORIDE 0.9 % IV SOLN
2.0000 g | INTRAVENOUS | Status: DC
  Administered 2024-02-28 – 2024-02-29 (×2): 2 g via INTRAVENOUS
  Filled 2024-02-28 (×3): qty 20

## 2024-02-28 MED ORDER — MORPHINE SULFATE ER 30 MG PO TBCR: 60.0000 mg | EXTENDED_RELEASE_TABLET | Freq: Two times a day (BID) | ORAL | Status: DC

## 2024-02-28 MED ORDER — SODIUM CHLORIDE 0.9 % IV SOLN: INTRAVENOUS | Status: AC | PRN

## 2024-02-28 MED ORDER — LIDOCAINE 5 % EX PTCH
1.0000 | MEDICATED_PATCH | CUTANEOUS | Status: DC | PRN
  Administered 2024-02-29: 1 via TRANSDERMAL
  Filled 2024-02-28: qty 1

## 2024-02-28 MED ORDER — IPRATROPIUM-ALBUTEROL 0.5-2.5 (3) MG/3ML IN SOLN
3.0000 mL | Freq: Four times a day (QID) | RESPIRATORY_TRACT | Status: DC
  Administered 2024-02-28 (×2): 3 mL via RESPIRATORY_TRACT
  Filled 2024-02-28 (×2): qty 3

## 2024-02-28 MED ORDER — SENNOSIDES-DOCUSATE SODIUM 8.6-50 MG PO TABS: 1.0000 | ORAL_TABLET | Freq: Every evening | ORAL | Status: DC | PRN

## 2024-02-28 MED ORDER — OSELTAMIVIR PHOSPHATE 75 MG PO CAPS
75.0000 mg | ORAL_CAPSULE | Freq: Once | ORAL | Status: AC
  Administered 2024-02-28: 75 mg via ORAL
  Filled 2024-02-28: qty 1

## 2024-02-28 MED ORDER — METHYLPREDNISOLONE SODIUM SUCC 40 MG IJ SOLR
40.0000 mg | Freq: Two times a day (BID) | INTRAMUSCULAR | Status: DC
  Administered 2024-02-28 – 2024-03-01 (×5): 40 mg via INTRAVENOUS
  Filled 2024-02-28 (×5): qty 1

## 2024-02-28 MED ORDER — ONDANSETRON HCL 4 MG/2ML IJ SOLN: 4.0000 mg | Freq: Four times a day (QID) | INTRAMUSCULAR | Status: DC | PRN

## 2024-02-28 MED ORDER — ZOLPIDEM TARTRATE 5 MG PO TABS
5.0000 mg | ORAL_TABLET | Freq: Every evening | ORAL | Status: DC | PRN
  Administered 2024-02-29: 5 mg via ORAL
  Filled 2024-02-28: qty 1

## 2024-02-28 MED ORDER — MORPHINE SULFATE ER 30 MG PO TBCR
60.0000 mg | EXTENDED_RELEASE_TABLET | Freq: Two times a day (BID) | ORAL | Status: DC
  Administered 2024-02-28 – 2024-03-01 (×4): 60 mg via ORAL
  Filled 2024-02-28 (×3): qty 2
  Filled 2024-02-28: qty 4

## 2024-02-28 MED ORDER — ACETAMINOPHEN 325 MG PO TABS
650.0000 mg | ORAL_TABLET | Freq: Four times a day (QID) | ORAL | Status: DC | PRN
  Administered 2024-02-28 – 2024-02-29 (×2): 650 mg via ORAL
  Filled 2024-02-28 (×2): qty 2

## 2024-02-28 MED ORDER — ENOXAPARIN SODIUM 40 MG/0.4ML IJ SOSY
40.0000 mg | PREFILLED_SYRINGE | INTRAMUSCULAR | Status: DC
  Filled 2024-02-28: qty 0.4

## 2024-02-28 MED ORDER — IPRATROPIUM-ALBUTEROL 0.5-2.5 (3) MG/3ML IN SOLN
9.0000 mL | Freq: Once | RESPIRATORY_TRACT | Status: AC
  Administered 2024-02-28: 9 mL via RESPIRATORY_TRACT
  Filled 2024-02-28: qty 9

## 2024-02-28 MED ORDER — ASPIRIN 81 MG PO TBEC
81.0000 mg | DELAYED_RELEASE_TABLET | Freq: Every day | ORAL | Status: DC
  Administered 2024-02-28 – 2024-03-01 (×3): 81 mg via ORAL
  Filled 2024-02-28 (×3): qty 1

## 2024-02-28 MED ORDER — OXYCODONE HCL 5 MG PO TABS
15.0000 mg | ORAL_TABLET | Freq: Four times a day (QID) | ORAL | Status: DC | PRN
  Administered 2024-02-28 – 2024-03-01 (×5): 15 mg via ORAL
  Filled 2024-02-28 (×5): qty 3

## 2024-02-28 MED ORDER — ONDANSETRON HCL 4 MG PO TABS: 4.0000 mg | ORAL_TABLET | Freq: Four times a day (QID) | ORAL | Status: DC | PRN

## 2024-02-28 MED ORDER — BENZONATATE 100 MG PO CAPS
100.0000 mg | ORAL_CAPSULE | Freq: Two times a day (BID) | ORAL | Status: DC
  Administered 2024-02-28 – 2024-03-01 (×5): 100 mg via ORAL
  Filled 2024-02-28 (×5): qty 1

## 2024-02-28 MED ORDER — AZITHROMYCIN 500 MG PO TABS
500.0000 mg | ORAL_TABLET | Freq: Every day | ORAL | Status: DC
  Administered 2024-02-29 – 2024-03-01 (×2): 500 mg via ORAL
  Filled 2024-02-28 (×2): qty 1

## 2024-02-28 MED ORDER — DM-GUAIFENESIN ER 30-600 MG PO TB12
1.0000 | ORAL_TABLET | ORAL | Status: DC | PRN
  Administered 2024-02-28: 1 via ORAL
  Filled 2024-02-28: qty 1

## 2024-02-28 MED ORDER — TAMSULOSIN HCL 0.4 MG PO CAPS
0.4000 mg | ORAL_CAPSULE | Freq: Every day | ORAL | Status: DC
  Administered 2024-02-28 – 2024-02-29 (×2): 0.4 mg via ORAL
  Filled 2024-02-28 (×2): qty 1

## 2024-02-28 MED ORDER — ACETAMINOPHEN 650 MG RE SUPP: 650.0000 mg | Freq: Four times a day (QID) | RECTAL | Status: DC | PRN

## 2024-02-28 MED ORDER — LACTATED RINGERS IV SOLN: INTRAVENOUS | Status: DC

## 2024-02-28 MED ORDER — SODIUM CHLORIDE 0.9 % IV SOLN
2.0000 g | Freq: Once | INTRAVENOUS | Status: AC
  Administered 2024-02-28: 2 g via INTRAVENOUS
  Filled 2024-02-28: qty 12.5

## 2024-02-28 MED ORDER — ALBUTEROL SULFATE (2.5 MG/3ML) 0.083% IN NEBU: 2.5000 mg | INHALATION_SOLUTION | Freq: Four times a day (QID) | RESPIRATORY_TRACT | Status: DC | PRN

## 2024-02-28 MED ORDER — AZITHROMYCIN 500 MG IV SOLR
500.0000 mg | Freq: Once | INTRAVENOUS | Status: AC
  Administered 2024-02-28: 500 mg via INTRAVENOUS
  Filled 2024-02-28: qty 5

## 2024-02-28 NOTE — ED Notes (Signed)
 Minimal itching at IV site, will monitor.

## 2024-02-28 NOTE — ED Triage Notes (Signed)
 Pt from home via ems- diagnosed with pneumonia on 12/23 started taking amoxicillin  with only 2 doses left, pt reports sob and non productive cough all night long. When ems arrived pt was 80% on home neb. Pt was placed on 6L [place] sats in low 90's. Pt was administered 125mg  solumedrol PTA and given 2 duo nebs. Pt wears home O2 2L. Current smoker.

## 2024-02-28 NOTE — Sepsis Progress Note (Signed)
 Elink following code sepsis

## 2024-02-28 NOTE — Progress Notes (Signed)
 CODE SEPSIS - PHARMACY COMMUNICATION  **Broad Spectrum Antibiotics should be administered within 1 hour of Sepsis diagnosis**  Time Code Sepsis Called/Page Received: 9052  Antibiotics Ordered: Azithromycin , cefepime , vancomycin   Time of 1st antibiotic administration: 1007  Additional action taken by pharmacy: -  If necessary, Name of Provider/Nurse Contacted: -    Lum VEAR Mania ,PharmD Clinical Pharmacist  02/28/2024  9:47 AM

## 2024-02-28 NOTE — ED Notes (Signed)
 Woke startled and anxious, accidentally pulled L FA IV out.

## 2024-02-28 NOTE — ED Provider Notes (Addendum)
 "  [facility] Provider Note    Event Date/Time   First MD Initiated Contact with Patient 02/28/24 0900     (approximate)   History   Cough and Shortness of Breath   HPI  Jared Farley is a 63 y.o. male past medical history significant for PTSD, COPD on 2 L nasal cannula, tobacco use, presents to the emergency department with shortness of breath.  Patient states that he has been having progressively worsening shortness of breath since the 23rd.  Patient was evaluated in the emergency department at that time and diagnosed with pneumonia.  Started on amoxicillin  and has been compliant with his amoxicillin  since that time.  Significantly worsening breathing problems over the night with worsening cough with no sputum production.  Endorses chills but no fever.  EMS stated that he started hallucinating that his dog was on the EMS truck with him whenever they were and route.  Denies any chest pain.  Denies history of DVT or PE.  Denies any history of heart failure.  States that he has chronic leg swelling.  When EMS arrived patient was hypoxic in the 80s on his breathing treatment with an nonrebreather mask.  Placed on 2 L nasal cannula and continued to be hypoxic and was increased to 6.  Patient received IV Solu-Medrol  125 mg, 2 DuoNebs and route     Physical Exam   Triage Vital Signs: ED Triage Vitals  Encounter Vitals Group     BP      Girls Systolic BP Percentile      Girls Diastolic BP Percentile      Boys Systolic BP Percentile      Boys Diastolic BP Percentile      Pulse      Resp      Temp      Temp src      SpO2      Weight      Height      Head Circumference      Peak Flow      Pain Score      Pain Loc      Pain Education      Exclude from Growth Chart     Most recent vital signs: Vitals:   02/28/24 1010 02/28/24 1013  BP:    Pulse: 100   Resp: 19   Temp:    SpO2: 98% 94%    Physical Exam Constitutional:      General: He is in  acute distress.     Appearance: He is well-developed. He is ill-appearing.  HENT:     Head: Atraumatic.  Eyes:     Extraocular Movements: Extraocular movements intact.     Conjunctiva/sclera: Conjunctivae normal.     Pupils: Pupils are equal, round, and reactive to light.  Cardiovascular:     Rate and Rhythm: Regular rhythm. Tachycardia present.  Pulmonary:     Effort: Respiratory distress present.     Breath sounds: Wheezing and rales present.  Abdominal:     Tenderness: There is no abdominal tenderness.  Musculoskeletal:     Cervical back: Normal range of motion.     Right lower leg: Edema present.     Left lower leg: Edema present.  Skin:    General: Skin is warm.     Capillary Refill: Capillary refill takes less than 2 seconds.  Neurological:     Mental Status: He is alert. Mental status is at baseline.  IMPRESSION / MDM / ASSESSMENT AND PLAN / ED COURSE  I reviewed the triage vital signs and the nursing notes.  On arrival obvious respiratory distress on 4 L nasal cannula at 92%.  Afebrile but tachycardic and tachypneic and normotensive.  Differential diagnosis including viral illness including COVID/influenza, COPD exacerbation, pneumonia, pulmonary edema, ACS, anemia   EKG  I, Clotilda Punter, the attending physician, personally viewed and interpreted this ECG.  Normal sinus rhythm, heart rate of 98.  Narrow complex.  QTc 424.  No significant ST elevation or depression.  No findings of acute ischemia or dysrhythmia.  No tachycardic or bradycardic dysrhythmias while on cardiac telemetry.  RADIOLOGY I independently reviewed imaging, my interpretation of imaging: Chest x-ray -my evaluation concern for consolidation pneumonia to the left lower lobe.  Read pending.  LABS (all labs ordered are listed, but only abnormal results are displayed) Labs interpreted as -    Labs Reviewed  RESP PANEL BY RT-PCR (RSV, FLU A&B, COVID)  RVPGX2 - Abnormal; Notable for the  following components:      Result Value   Influenza A by PCR POSITIVE (*)    All other components within normal limits  COMPREHENSIVE METABOLIC PANEL WITH GFR - Abnormal; Notable for the following components:   Sodium 131 (*)    Chloride 95 (*)    Glucose, Bld 119 (*)    Calcium 8.4 (*)    All other components within normal limits  CBC WITH DIFFERENTIAL/PLATELET - Abnormal; Notable for the following components:   WBC 17.6 (*)    RBC 3.82 (*)    Hemoglobin 12.1 (*)    HCT 35.8 (*)    Neutro Abs 12.8 (*)    Monocytes Absolute 1.2 (*)    Abs Immature Granulocytes 0.13 (*)    All other components within normal limits  BLOOD GAS, VENOUS - Abnormal; Notable for the following components:   pH, Ven 7.46 (*)    Bicarbonate 31.3 (*)    Acid-Base Excess 6.6 (*)    All other components within normal limits  CULTURE, BLOOD (ROUTINE X 2)  CULTURE, BLOOD (ROUTINE X 2)  LACTIC ACID, PLASMA  PRO BRAIN NATRIURETIC PEPTIDE  LACTIC ACID, PLASMA  TROPONIN T, HIGH SENSITIVITY     MDM    On arrival patient was significant respiratory distress, clinic picture concerning for COPD exacerbation or pneumonia that has failed outpatient treatment with amoxicillin .  Already received IV Solu-Medrol  and DuoNebs with EMS.  Given another DuoNeb treatment and IV magnesium .  VBG without significant hypercarbia.  On my evaluation concern for pneumonia.  Given that he has failed outside treatment with amoxicillin  will broaden out his antibiotics with vancomycin , cefepime  and azithromycin .  Consulted hospitalist for admission for acute hypoxic respiratory failure in the setting of COPD exacerbation and pneumonia.  This is a positive for influenza, ordered Tamiflu .   PROCEDURES:  Critical Care performed: yes  .Critical Care  Performed by: Punter Clotilda, MD Authorized by: Punter Clotilda, MD   Critical care provider statement:    Critical care time (minutes):  40   Critical care time was exclusive of:   Separately billable procedures and treating other patients   Critical care was necessary to treat or prevent imminent or life-threatening deterioration of the following conditions:  Respiratory failure   Critical care was time spent personally by me on the following activities:  Development of treatment plan with patient or surrogate, discussions with consultants, evaluation of patient's response to treatment, examination of patient, ordering  and review of laboratory studies, ordering and review of radiographic studies, ordering and performing treatments and interventions, pulse oximetry, re-evaluation of patient's condition and review of old charts   Patient's presentation is most consistent with acute presentation with potential threat to life or bodily function.   MEDICATIONS ORDERED IN ED: Medications  vancomycin  (VANCOCIN ) IVPB 1000 mg/200 mL premix (1,000 mg Intravenous New Bag/Given 02/28/24 1009)  ceFEPIme  (MAXIPIME ) 2 g in sodium chloride  0.9 % 100 mL IVPB (2 g Intravenous New Bag/Given 02/28/24 1007)  azithromycin  (ZITHROMAX ) 500 mg in sodium chloride  0.9 % 250 mL IVPB (500 mg Intravenous New Bag/Given 02/28/24 1012)  oseltamivir  (TAMIFLU ) capsule 75 mg (has no administration in time range)  ipratropium-albuterol  (DUONEB) 0.5-2.5 (3) MG/3ML nebulizer solution 9 mL (9 mLs Nebulization Given 02/28/24 0932)  magnesium  sulfate IVPB 2 g 50 mL (0 g Intravenous Stopped 02/28/24 1015)    FINAL CLINICAL IMPRESSION(S) / ED DIAGNOSES   Final diagnoses:  COPD exacerbation (HCC)  Hypoxia  Shortness of breath  Community acquired pneumonia, unspecified laterality  Influenza     Rx / DC Orders   ED Discharge Orders     None        Note:  This document was prepared using Dragon voice recognition software and may include unintentional dictation errors.   Suzanne Kirsch, MD 02/28/24 1008    Suzanne Kirsch, MD 02/28/24 1024  "

## 2024-02-28 NOTE — ED Notes (Signed)
 Resting sleeping on [place], aresol mask remains in plac, pt is mouth breathing at this time. SPO2 94% with [place] O2 4.5L

## 2024-02-28 NOTE — H&P (Addendum)
 " History and Physical    Jared Farley FMW:[id number] DOB: 02/20/1961 DOA: 02/28/2024  PCP: Sherial Bail, MD (Confirm with patient/family/NH records and if not entered, this has to be entered at [facility] point of entry) Patient coming from: Home  I have personally briefly reviewed patient's old medical records in [facility]  Chief Complaint: Cough, wheezing, SOB  HPI: Jared Farley is a 63 y.o. male with medical history significant of COPD Gold stage II, interstitial lung disease chronic hypoxic respiratory failure on 2 L., CAD, chronic pain syndrome on narcotics, presented with worsening of cough wheezing shortness of breath and fever at home.  Symptoms started about 5 days ago, when patient started to have a runny nose headache congestion, productive cough with whitish to yellowish sputum and wheezing with increasing exertional dyspnea.  He came to ED on 12/23, was diagnosed with COPD exacerbation, workup was benign and patient sent home with Augmentin  and prednisone .  Last 4 days despite taking antibiotics and steroid, his symptoms of wheezing shortness of breath and cycles of subjective fever and chills continue.  EMS arrived to his home and found his O2 saturation in the lower 80s and he was placed on 6 L and 1 dose of 125 mg Solu-Medrol  given. ED Course: Temperature 99.3, tachycardia heart rate 100-1 tens, tachypneic and blood pressure 130/70 O2 saturation 98% on 4 L.  Chest x-ray showed left lower lobe infiltrates and chronic interstitial changes.  Blood work showed WBC 17.6 with hemoglobin 12 BUN 8 creatinine 0.7 bicarb 26, VBG 7.40 6/44/45.  Nasal swab positive for influenza A.  Patient was given cefepime  and azithromycin  in the ED.  Review of Systems: As per HPI otherwise 14 point review of systems negative.    Past Medical History:  Diagnosis Date   Anxiety    Chronic pain following surgery or procedure    testicular   COPD (chronic obstructive pulmonary disease) (HCC)     Coronary artery disease    Degenerative joint disease    Depression    Dyspnea    Pneumonia    Seizures (HCC)     Past Surgical History:  Procedure Laterality Date   ANKLE SURGERY     COLONOSCOPY W/ POLYPECTOMY     CYSTOSCOPY N/A 02/27/2022   Procedure: CYSTOSCOPY;  Surgeon: Francisca Redell BROCKS, MD;  Location: [facility] ORS;  Service: Urology;  Laterality: N/A;   dental work     NASAL FRACTURE SURGERY     x 3   VASECTOMY       reports that he has been smoking cigarettes. He has never used smokeless tobacco. He reports that he does not currently use alcohol. He reports current drug use. Drug: Marijuana.  Allergies[1]  Family History  Problem Relation Age of Onset   Depression Mother    Depression Brother    Suicidality Brother      Prior to Admission medications  Medication Sig Start Date End Date Taking? Authorizing Provider  albuterol  (VENTOLIN  HFA) 108 (90 Base) MCG/ACT inhaler Inhale into the lungs. 02/07/21  Yes [provider]  amoxicillin -clavulanate (AUGMENTIN ) 875-125 MG tablet Take 1 tablet by mouth 2 (two) times daily for 7 days. 02/22/24 02/29/24 Yes Fernand Rossie HERO, MD  aspirin  81 MG tablet Take 81 mg by mouth daily. Last dose on 02/23/22 prior to procedure   Yes [provider]  benzonatate  (TESSALON ) 100 MG capsule Take 1 capsule (100 mg total) by mouth 2 (two) times daily. 02/22/24  Yes Fernand Rossie  M, MD  Cholecalciferol (VITAMIN D -3 PO) Take by mouth.   Yes [provider]  dextromethorphan -guaiFENesin  (MUCINEX  DM) 30-600 MG 12hr tablet Take 1 tablet by mouth as needed for cough.   Yes [provider]  Fluticasone-Umeclidin-Vilant (TRELEGY ELLIPTA) 200-62.5-25 MCG/ACT AEPB Inhale 1 puff into the lungs daily.   Yes [provider]  ipratropium-albuterol  (DUONEB) 0.5-2.5 (3) MG/3ML SOLN every 6 (six) hours as needed. 11/14/19  Yes [provider]  lidocaine  (LIDODERM ) 5 % 1 patch as needed. 09/10/21  Yes [provider]  morphine  (MS CONTIN ) 60 MG 12 hr tablet Take 60 mg by mouth 2 (two) times daily.   Yes [provider]  oxyCODONE  (ROXICODONE ) 15 MG immediate release tablet 15 mg every 6 (six) hours as needed. 03/30/15  Yes [provider]  predniSONE  (DELTASONE ) 10 MG tablet Take by mouth every other day. 08/12/20  Yes [provider]  silodosin  (RAPAFLO ) 8 MG CAPS capsule Take 1 capsule (8 mg total) by mouth daily with breakfast. 04/23/22  Yes McGowan, Clotilda A, PA-C  triamcinolone  cream (KENALOG ) 0.1 % Apply 1 application topically 2 (two) times daily. Patient taking differently: Apply 1 application  topically daily. 05/17/15  Yes Vannie Delon LABOR, MD    Physical Exam: Vitals:   02/28/24 1115 02/28/24 1130 02/28/24 1145 02/28/24 1200  BP: (!) 120/59 109/60 109/62 113/60  Pulse: 99 (!) 102 97 96  Resp: 17 19 15 14   Temp:      TempSrc:      SpO2:      Weight:      Height:        Constitutional: NAD, calm, comfortable Vitals:   02/28/24 1115 02/28/24 1130 02/28/24 1145 02/28/24 1200  BP: (!) 120/59 109/60 109/62 113/60  Pulse: 99 (!) 102 97 96  Resp: 17 19 15 14   Temp:      TempSrc:      SpO2:      Weight:      Height:       Eyes: PERRL, lids and conjunctivae normal ENMT: Mucous membranes are moist. Posterior pharynx clear of any exudate or lesions.Normal dentition.  Neck: normal, supple, no masses, no thyromegaly Respiratory: Diminished breathing sound bilaterally, increasing coarse crackles more on the left side, scattered wheezing, increasing respiratory effort. No accessory muscle use.  Cardiovascular: Regular rate and rhythm, no murmurs / rubs / gallops. No extremity edema. 2+ pedal pulses. No carotid bruits.  Abdomen: no tenderness, no masses palpated. No hepatosplenomegaly. Bowel sounds positive.  Musculoskeletal: no clubbing / cyanosis. No joint deformity upper and lower extremities. Good ROM, no contractures. Normal muscle tone.  Skin: no  rashes, lesions, ulcers. No induration Neurologic: CN 2-12 grossly intact. Sensation intact, DTR normal. Strength 5/5 in all 4.  Psychiatric: Normal judgment and insight. Alert and oriented x 3. Normal mood.     Labs on Admission: I have personally reviewed following labs and imaging studies  CBC: Recent Labs  Lab 02/22/24 1039 02/28/24 0912  WBC 8.3 17.6*  NEUTROABS 6.2 12.8*  HGB 12.9* 12.1*  HCT 37.4* 35.8*  MCV 93.7 93.7  PLT 212 229   Basic Metabolic Panel: Recent Labs  Lab 02/22/24 1039 02/28/24 0912  NA 135 131*  K 3.9 3.5  CL 99 95*  CO2 23 26  GLUCOSE 119* 119*  BUN 15 8  CREATININE 0.94 0.78  CALCIUM 8.8* 8.4*   GFR: Estimated Creatinine Clearance: 105.1 mL/min (by C-G formula based on SCr of 0.78 mg/dL).  Liver Function Tests: Recent Labs  Lab 02/22/24 1039 02/28/24 0912  AST 36 26  ALT 16 22  ALKPHOS 60 47  BILITOT 0.6 0.8  PROT 7.8 7.2  ALBUMIN 4.1 3.7   No results for input(s): LIPASE, AMYLASE in the last 168 hours. No results for input(s): AMMONIA in the last 168 hours. Coagulation Profile: No results for input(s): INR, PROTIME in the last 168 hours. Cardiac Enzymes: No results for input(s): CKTOTAL, CKMB, CKMBINDEX, TROPONINI in the last 168 hours. BNP (last 3 results) Recent Labs    02/28/24 0912  PROBNP 261.0   HbA1C: No results for input(s): HGBA1C in the last 72 hours. CBG: No results for input(s): GLUCAP in the last 168 hours. Lipid Profile: No results for input(s): CHOL, HDL, LDLCALC, TRIG, CHOLHDL, LDLDIRECT in the last 72 hours. Thyroid  Function Tests: No results for input(s): TSH, T4TOTAL, FREET4, T3FREE, THYROIDAB in the last 72 hours. Anemia Panel: No results for input(s): VITAMINB12, FOLATE, FERRITIN, TIBC, IRON, RETICCTPCT in the last 72 hours. Urine analysis:    Component Value Date/Time   COLORURINE Light Orange (A) 11/28/2013 0802   APPEARANCEUR Clear  02/18/2022 1525   LABSPEC 1.025 11/28/2013 0802   PHURINE 5.5 11/28/2013 0802   GLUCOSEU Negative 02/18/2022 1525   GLUCOSEU NEGATIVE 11/28/2013 0802   HGBUR TRACE-INTACT (A) 11/28/2013 0802   BILIRUBINUR Negative 02/18/2022 1525   KETONESUR TRACE (A) 11/28/2013 0802   PROTEINUR Negative 02/18/2022 1525   UROBILINOGEN 0.2 11/28/2013 0802   NITRITE Negative 02/18/2022 1525   NITRITE NEGATIVE 11/28/2013 0802   LEUKOCYTESUR Negative 02/18/2022 1525    Radiological Exams on Admission: DG Chest Port 1 View Result Date: 02/28/2024 EXAM: 1 VIEW XRAY OF THE CHEST 02/28/2024 09:29:00 AM COMPARISON: 02/22/2024 CLINICAL HISTORY: Questionable sepsis - evaluate for abnormality FINDINGS: LUNGS AND PLEURA: Emphysema. Chronic coarsened interstitial markings with increased superimposed patchy airspace opacity bilaterally. No pleural effusion. No pneumothorax. HEART AND MEDIASTINUM: No acute abnormality of the cardiac and mediastinal silhouettes. BONES AND SOFT TISSUES: No acute osseous abnormality. IMPRESSION: 1. Increased patchy airspace opacity bilaterally. Electronically signed by: Morgane Naveau MD 02/28/2024 11:41 AM EST RP Workstation: [id number]    EKG: Independently reviewed.  Sinus rhythm, no acute ST changes.  Assessment/Plan Principal Problem:   PNA (pneumonia) Active Problems:   CAP (community acquired pneumonia)   Influenza A  (please populate well all problems here in Problem List. (For example, if patient is on BP meds at home and you resume or decide to hold them, it is a problem that needs to be her. Same for CAD, COPD, HLD and so on)  Acute on chronic hypoxic respiratory failure COPD exacerbation Hx of ILD CAP, failed outpatient management -Continue IV Solu-Medrol  - ICS and LABA - DuoNebs and as needed albuterol  - Incentive spirometry and flutter valve  Sepsis without acute endorgan damage - Sepsis as evidenced by tachycardia, new onset of hypoxia/tachypneic, source of  infection is LLL CAP, bacterial, failed outpatient management.  Leukocytosis probably secondary to recent steroid use. - Culture sputum - Ceftriaxone  and azithromycin  - Check atypical antigens. - Patient appears to be euvolemic, lactic acid level within normal limits, will hold off IV resuscitation at this point.  Influenza A infection - URI symptoms started 5 days ago, out of window for Tamiflu .  Chronic pain syndrome - Continue current narcotic regimen and outpatient follow-up with pain management.  BPH - No urinary symptoms.  Check UA - Continue Flomax   DVT prophylaxis: Lovenox Code Status: Full code Family Communication:  Patient's brother at bedside Disposition Plan: Patient is sick with sepsis hypoxic respiratory failure requiring IV antibiotics, IV steroid, expect more than 2 midnight hospital stay. Consults called: None Admission status: Telemetry admission   Cort ONEIDA Mana MD [facility] Pager [phone]  02/28/2024, 12:12 PM        [1]  Allergies Allergen Reactions   Trazodone And Nefazodone    "

## 2024-02-29 DIAGNOSIS — J9621 Acute and chronic respiratory failure with hypoxia: Secondary | ICD-10-CM

## 2024-02-29 DIAGNOSIS — F17200 Nicotine dependence, unspecified, uncomplicated: Secondary | ICD-10-CM | POA: Insufficient documentation

## 2024-02-29 LAB — STREP PNEUMONIAE URINARY ANTIGEN: Strep Pneumo Urinary Antigen: NEGATIVE

## 2024-02-29 LAB — CBC
HCT: 33.2 % — ABNORMAL LOW (ref 39.0–52.0)
Hemoglobin: 11.3 g/dL — ABNORMAL LOW (ref 13.0–17.0)
MCH: 32 pg (ref 26.0–34.0)
MCHC: 34 g/dL (ref 30.0–36.0)
MCV: 94.1 fL (ref 80.0–100.0)
Platelets: 263 K/uL (ref 150–400)
RBC: 3.53 MIL/uL — ABNORMAL LOW (ref 4.22–5.81)
RDW: 13.2 % (ref 11.5–15.5)
WBC: 13.1 K/uL — ABNORMAL HIGH (ref 4.0–10.5)
nRBC: 0 % (ref 0.0–0.2)

## 2024-02-29 LAB — MRSA NEXT GEN BY PCR, NASAL: MRSA by PCR Next Gen: NOT DETECTED

## 2024-02-29 MED ORDER — NICOTINE 21 MG/24HR TD PT24
21.0000 mg | MEDICATED_PATCH | Freq: Every day | TRANSDERMAL | Status: DC
  Filled 2024-02-29: qty 1

## 2024-02-29 MED ORDER — KETOROLAC TROMETHAMINE 30 MG/ML IJ SOLN: 30.0000 mg | Freq: Once | INTRAMUSCULAR | Status: DC

## 2024-02-29 NOTE — Assessment & Plan Note (Signed)
-   Continue home oxycodone  and morphine

## 2024-02-29 NOTE — Assessment & Plan Note (Signed)
 Secondary to pneumonia with recent influenza A. -Continue with bronchodilators -Continue with steroid -Supportive care

## 2024-02-29 NOTE — Assessment & Plan Note (Signed)
 Patient met sepsis criteria with leukocytosis, tachycardia and tachypnea, secondary to pneumonia with history of recent influenza A infection. MRSA PCR negative. - Continue with Zithromax  and ceftriaxone  -Continue supportive care

## 2024-02-29 NOTE — Assessment & Plan Note (Signed)
 Tested positive for influenza A, symptoms for more than 5 days now.  Out of window for Tamiflu . - Continue with supportive care

## 2024-02-29 NOTE — Assessment & Plan Note (Addendum)
 Apparently patient was supposed to use 2 L of oxygen at baseline, he returned his oxygen stating that he could not stop smoking and does not want to blow himself with oxygen nearby. Patient initially need for up to 6 L of oxygen, currently on 4 L. -Patient was counseled -Likely will need home oxygen-need documentation if he refuses

## 2024-02-29 NOTE — Plan of Care (Signed)
  Problem: Education: ?Goal: Knowledge of General Education information will improve ?Description: Including pain rating scale, medication(s)/side effects and non-pharmacologic comfort measures ?Outcome: Progressing ?  ?Problem: Health Behavior/Discharge Planning: ?Goal: Ability to manage health-related needs will improve ?Outcome: Progressing ?  ?Problem: Clinical Measurements: ?Goal: Will remain free from infection ?Outcome: Progressing ?  ?Problem: Clinical Measurements: ?Goal: Ability to maintain clinical measurements within normal limits will improve ?Outcome: Progressing ?  ?

## 2024-02-29 NOTE — Assessment & Plan Note (Signed)
 Continue home Flomax

## 2024-02-29 NOTE — Plan of Care (Signed)
   Problem: Education: Goal: Knowledge of General Education information will improve Description: Including pain rating scale, medication(s)/side effects and non-pharmacologic comfort measures Outcome: Not Progressing   Problem: Health Behavior/Discharge Planning: Goal: Ability to manage health-related needs will improve Outcome: Not Progressing   Problem: Clinical Measurements: Goal: Ability to maintain clinical measurements within normal limits will improve Outcome: Not Progressing Goal: Will remain free from infection Outcome: Not Progressing Goal: Diagnostic test results will improve Outcome: Not Progressing Goal: Respiratory complications will improve Outcome: Not Progressing Goal: Cardiovascular complication will be avoided Outcome: Not Progressing   Problem: Activity: Goal: Risk for activity intolerance will decrease Outcome: Not Progressing   Problem: Nutrition: Goal: Adequate nutrition will be maintained Outcome: Not Progressing   Problem: Coping: Goal: Level of anxiety will decrease Outcome: Not Progressing   Problem: Elimination: Goal: Will not experience complications related to bowel motility Outcome: Not Progressing Goal: Will not experience complications related to urinary retention Outcome: Not Progressing   Problem: Pain Managment: Goal: General experience of comfort will improve and/or be controlled Outcome: Not Progressing   Problem: Safety: Goal: Ability to remain free from injury will improve Outcome: Not Progressing   Problem: Skin Integrity: Goal: Risk for impaired skin integrity will decrease Outcome: Not Progressing   Problem: Activity: Goal: Ability to tolerate increased activity will improve Outcome: Not Progressing   Problem: Clinical Measurements: Goal: Ability to maintain a body temperature in the normal range will improve Outcome: Not Progressing   Problem: Respiratory: Goal: Ability to maintain adequate ventilation will  improve Outcome: Not Progressing Goal: Ability to maintain a clear airway will improve Outcome: Not Progressing

## 2024-02-29 NOTE — Hospital Course (Addendum)
 Partly taken from H&P.  Jared Farley is a 63 y.o. male with medical history significant of COPD Gold stage II, interstitial lung disease chronic hypoxic respiratory failure on 2 L., CAD, chronic pain syndrome on narcotics, presented with worsening of cough wheezing shortness of breath and fever at home.   He came to ED on 12/23, was diagnosed with COPD exacerbation, workup was benign and patient sent home with Augmentin  and prednisone .  His symptoms continued to get worse despite taking antibiotics and steroid.  So EMS was called.  Per EMS report patient was desaturating in the low 80s and was placed on 6 L of oxygen.  On presentation patient was tachycardic and tachypneic, labs with leukocytosis 17.6,VBG 7.40 6/44/45.  Respiratory panel positive for influenza A. Chest x-ray showed left lower lobe infiltrates and chronic interstitial changes.   Patient was out of window for Tamiflu , started on antibiotics for concern of superadded bacterial infection.  12/30: Vital stable on 4 L of oxygen, procalcitonin negative, preliminary blood cultures negative.  proBNP normal.  MRSA PCR negative.  Per patient he was given home oxygen before which he return as he cannot stop smoking and does not want to blow himself up with smoking and using oxygen together.  Counseling was provided as he will need oxygen for home.

## 2024-02-29 NOTE — Assessment & Plan Note (Signed)
 Counseling was provided. Patient is a lifelong heavy smoker. - Nicotine patch as needed

## 2024-02-29 NOTE — Progress Notes (Signed)
 " Progress Note   Patient: Jared Farley DOB: Jul 11, 1960 DOA: 02/28/2024     1 DOS: the patient was seen and examined on 02/29/2024   Brief hospital course: Partly taken from H&P.  Jared Farley is a 63 y.o. male with medical history significant of COPD Gold stage II, interstitial lung disease chronic hypoxic respiratory failure on 2 L., CAD, chronic pain syndrome on narcotics, presented with worsening of cough wheezing shortness of breath and fever at home.   He came to ED on 12/23, was diagnosed with COPD exacerbation, workup was benign and patient sent home with Augmentin  and prednisone .  His symptoms continued to get worse despite taking antibiotics and steroid.  So EMS was called.  Per EMS report patient was desaturating in the low 80s and was placed on 6 L of oxygen.  On presentation patient was tachycardic and tachypneic, labs with leukocytosis 17.6,VBG 7.40 6/44/45.  Respiratory panel positive for influenza A. Chest x-ray showed left lower lobe infiltrates and chronic interstitial changes.   Patient was out of window for Tamiflu , started on antibiotics for concern of superadded bacterial infection.  12/30: Vital stable on 4 L of oxygen, procalcitonin negative, preliminary blood cultures negative.  proBNP normal.  MRSA PCR negative.  Per patient he was given home oxygen before which he return as he cannot stop smoking and does not want to blow himself up with smoking and using oxygen together.  Counseling was provided as he will need oxygen for home.  Assessment and Plan: * Sepsis due to pneumonia [facility]) Patient met sepsis criteria with leukocytosis, tachycardia and tachypnea, secondary to pneumonia with history of recent influenza A infection. MRSA PCR negative. - Continue with Zithromax  and ceftriaxone  -Continue supportive care  Influenza A Tested positive for influenza A, symptoms for more than 5 days now.  Out of window for Tamiflu . - Continue with  supportive care  Acute on chronic hypoxic respiratory failure (HCC) Apparently patient was supposed to use 2 L of oxygen at baseline, he returned his oxygen stating that he could not stop smoking and does not want to blow himself with oxygen nearby. Patient initially need for up to 6 L of oxygen, currently on 4 L. -Patient was counseled -Likely will need home oxygen-need documentation if he refuses  COPD with acute exacerbation (HCC) Secondary to pneumonia with recent influenza A. -Continue with bronchodilators -Continue with steroid -Supportive care  Chronic pain syndrome - Continue home oxycodone  and morphine   BPH (benign prostatic hyperplasia) - Continue home Flomax   Smoking addiction Counseling was provided. Patient is a lifelong heavy smoker. - Nicotine patch as needed   Subjective: Patient was seen and examined today.  Respiratory status improving.  Patient was not using home oxygen as he cannot stop smoking stating that he was born as smoking addict as his mother used to smoke during pregnancy around in his childhood and he started smoking pretty early in his life.  Physical Exam: Vitals:   02/28/24 1929 02/28/24 2111 02/29/24 0347 02/29/24 0739  BP: 121/67  114/73 123/80  Pulse: 84  76 99  Resp: 16  16 (!) 22  Temp: 97.7 F (36.5 C)  97.7 F (36.5 C) (!) 97.2 F (36.2 C)  TempSrc: Oral  Oral   SpO2: 92% 92% 90% 93%  Weight:      Height:       General.  Well-developed gentleman, in no acute distress. Pulmonary.  Scattered rhonchi bilaterally, normal respiratory effort. CV.  Regular rate and  rhythm, no JVD, rub or murmur. Abdomen.  Soft, nontender, nondistended, BS positive. CNS.  Alert and oriented .  No focal neurologic deficit. Extremities.  No edema, pulses intact and symmetrical. Psychiatry.  Judgment and insight appears normal.   Data Reviewed: Prior data reviewed  Family Communication: Discussed with patient  Disposition: Status is:  Inpatient Remains inpatient appropriate because: Severity of illness  Planned Discharge Destination: Home  DVT prophylaxis.  Lovenox Time spent: 50 minutes  Author: Amaryllis Dare, MD 02/29/2024 2:52 PM  For on call review [email or web address].  "

## 2024-03-01 ENCOUNTER — Other Ambulatory Visit: Payer: Self-pay

## 2024-03-01 DIAGNOSIS — E871 Hypo-osmolality and hyponatremia: Secondary | ICD-10-CM | POA: Insufficient documentation

## 2024-03-01 DIAGNOSIS — N400 Enlarged prostate without lower urinary tract symptoms: Secondary | ICD-10-CM

## 2024-03-01 DIAGNOSIS — A419 Sepsis, unspecified organism: Secondary | ICD-10-CM

## 2024-03-01 DIAGNOSIS — J101 Influenza due to other identified influenza virus with other respiratory manifestations: Secondary | ICD-10-CM

## 2024-03-01 DIAGNOSIS — G894 Chronic pain syndrome: Secondary | ICD-10-CM

## 2024-03-01 DIAGNOSIS — J9621 Acute and chronic respiratory failure with hypoxia: Secondary | ICD-10-CM

## 2024-03-01 DIAGNOSIS — J441 Chronic obstructive pulmonary disease with (acute) exacerbation: Secondary | ICD-10-CM

## 2024-03-01 DIAGNOSIS — F17200 Nicotine dependence, unspecified, uncomplicated: Secondary | ICD-10-CM

## 2024-03-01 LAB — LEGIONELLA PNEUMOPHILA SEROGP 1 UR AG: L. pneumophila Serogp 1 Ur Ag: NEGATIVE

## 2024-03-01 MED ORDER — AZITHROMYCIN 250 MG PO TABS
250.0000 mg | ORAL_TABLET | Freq: Every day | ORAL | 0 refills | Status: AC
  Filled 2024-03-01: qty 3, 3d supply, fill #0

## 2024-03-01 MED ORDER — PREDNISONE 20 MG PO TABS
60.0000 mg | ORAL_TABLET | Freq: Every day | ORAL | 0 refills | Status: AC
  Filled 2024-03-01: qty 9, 3d supply, fill #0

## 2024-03-01 MED ORDER — CEFDINIR 300 MG PO CAPS
300.0000 mg | ORAL_CAPSULE | Freq: Two times a day (BID) | ORAL | 0 refills | Status: AC
  Filled 2024-03-01: qty 6, 3d supply, fill #0

## 2024-03-01 MED ORDER — NICOTINE 21 MG/24HR TD PT24
21.0000 mg | MEDICATED_PATCH | Freq: Every day | TRANSDERMAL | 0 refills | Status: AC
  Filled 2024-03-01: qty 28, 28d supply, fill #0

## 2024-03-01 NOTE — Plan of Care (Signed)

## 2024-03-01 NOTE — Assessment & Plan Note (Signed)
 Nicotine  patch.

## 2024-03-01 NOTE — Discharge Summary (Signed)
 " Physician Discharge Summary   Patient: Jared Farley MRN: [id number] DOB: 01/12/1961  Admit date:     02/28/2024  Discharge date: 03/01/2024  Discharge Physician: Jared Farley   PCP: Jared Bail, MD   Recommendations at discharge:   Follow-up PCP 5 days  Discharge Diagnoses: Principal Problem:   Sepsis due to pneumonia Jared Farley) Active Problems:   Influenza A   Acute on chronic hypoxic respiratory failure (HCC)   COPD exacerbation (HCC)   Chronic pain syndrome   BPH (benign prostatic hyperplasia)   Smoking addiction   Hyponatremia  Resolved Problems:   * No resolved hospital problems. Jared Farley Course: Partly taken from H&P.  Jared Farley is a 63 y.o. male with medical history significant of COPD Gold stage II, interstitial lung disease chronic hypoxic respiratory failure on 2 L., CAD, chronic pain syndrome on narcotics, presented with worsening of cough wheezing shortness of breath and fever at home.   He came to ED on 12/23, was diagnosed with COPD exacerbation, workup was benign and patient sent home with Augmentin  and prednisone .  His symptoms continued to get worse despite taking antibiotics and steroid.  So EMS was called.  Per EMS report patient was desaturating in the low 80s and was placed on 6 L of oxygen.  On presentation patient was tachycardic and tachypneic, labs with leukocytosis 17.6,VBG 7.40 6/44/45.  Respiratory panel positive for influenza A. Chest x-ray showed left lower lobe infiltrates and chronic interstitial changes.   Patient was out of window for Tamiflu , started on antibiotics for concern of superadded bacterial infection.  12/30: Vital stable on 4 L of oxygen, procalcitonin negative, preliminary blood cultures negative.  proBNP normal.  MRSA PCR negative.  12/31.  Patient feeling much better and wants to go home.  Patient qualifies for oxygen at home.  Brought down to 2 L of oxygen with rest and 3 L with ambulation.  Assessment and  Plan: * Sepsis due to pneumonia [facility]) Patient met sepsis criteria with leukocytosis, tachycardia and tachypnea, secondary to pneumonia with history of recent influenza A infection. MRSA PCR negative. Patient given Rocephin  and Zithromax  while here will switch over to Omnicef and Zithromax  upon discharge.  Influenza A Tested positive for influenza A, symptoms for more than 5 days now.  Out of window for Tamiflu . - Continue with supportive care  Acute on chronic hypoxic respiratory failure [facility]) Patient qualifies for home oxygen.  2 L at rest and 3 L with ambulation.  Pulse ox at room air 88 to 89% with ambulation on room air down to 84%.  COPD exacerbation (HCC) Prednisone  for few more days then go back on his normal regimen.  Chronic pain syndrome Continue home oxycodone  and morphine   BPH (benign prostatic hyperplasia) Continue home Flomax   Hyponatremia Follow-up as outpatient.  Regular diet  Smoking addiction Nicotine patch         Consultants: None Procedures performed: None Disposition: Home Diet recommendation:  Regular diet DISCHARGE MEDICATION: Allergies as of 03/01/2024       Reactions   Trazodone And Nefazodone         Medication List     STOP taking these medications    amoxicillin -clavulanate 875-125 MG tablet Commonly known as: AUGMENTIN        TAKE these medications    albuterol  108 (90 Base) MCG/ACT inhaler Commonly known as: VENTOLIN  HFA Inhale into the lungs.   aspirin  81 MG tablet Take 81 mg by mouth daily. Last dose on 02/23/22  prior to procedure   azithromycin  250 MG tablet Commonly known as: ZITHROMAX  Take 1 tablet (250 mg total) by mouth daily for 3 days Start taking on: March 02, 2024   benzonatate  100 MG capsule Commonly known as: TESSALON  Take 1 capsule (100 mg total) by mouth 2 (two) times daily.   cefdinir 300 MG capsule Commonly known as: OMNICEF Take 1 capsule (300 mg total) by mouth 2 (two) times daily for 3  days.   dextromethorphan -guaiFENesin  30-600 MG 12hr tablet Commonly known as: MUCINEX  DM Take 1 tablet by mouth as needed for cough.   ipratropium-albuterol  0.5-2.5 (3) MG/3ML Soln Commonly known as: DUONEB every 6 (six) hours as needed.   lidocaine  5 % Commonly known as: LIDODERM  1 patch as needed.   morphine  60 MG 12 hr tablet Commonly known as: MS CONTIN  Take 60 mg by mouth 2 (two) times daily.   nicotine 21 mg/24hr patch Commonly known as: NICODERM CQ - dosed in mg/24 hours Place 1 patch (21 mg total) onto the skin daily. Start taking on: March 02, 2024   oxyCODONE  15 MG immediate release tablet Commonly known as: ROXICODONE  15 mg every 6 (six) hours as needed.   predniSONE  20 MG tablet Commonly known as: DELTASONE  Take 3 tablets (60 mg total) by mouth daily for three days then go back on how you normally take afterwards Start taking on: March 02, 2024 What changed:  medication strength how much to take when to take this   silodosin  8 MG Caps capsule Commonly known as: Rapaflo  Take 1 capsule (8 mg total) by mouth daily with breakfast.   Trelegy Ellipta 200-62.5-25 MCG/ACT Aepb Generic drug: Fluticasone-Umeclidin-Vilant Inhale 1 puff into the lungs daily.   triamcinolone  cream 0.1 % Commonly known as: KENALOG  Apply 1 application topically 2 (two) times daily. What changed: when to take this   VITAMIN D -3 PO Take by mouth.               Durable Medical Equipment  (From admission, onward)           Start     Ordered   03/01/24 1018  For home use only DME oxygen  Once       Comments: 2L at rest and 3L with walking  Question Answer Comment  Length of Need Lifetime   Mode or (Route) Nasal cannula   Liters per Minute 2   Frequency Continuous (stationary and portable oxygen unit needed)   Oxygen conserving device Yes   Oxygen delivery system: Gas      03/01/24 1018            Follow-up Information     Jared Bail, MD Follow  up in 5 day(s).   Specialty: Internal Medicine Contact information: [address] [place] [address] [phone]                Discharge Exam: Jared Farley   02/28/24 0905 02/28/24 0935  Weight: 79 kg 88.9 kg   Physical Exam HENT:     Head: Normocephalic.  Eyes:     General: Lids are normal.     Conjunctiva/sclera: Conjunctivae normal.  Cardiovascular:     Rate and Rhythm: Normal rate and regular rhythm.     Heart sounds: Normal heart sounds, S1 normal and S2 normal.  Pulmonary:     Breath sounds: Examination of the right-lower field reveals decreased breath sounds. Examination of the left-lower field reveals decreased breath sounds. Decreased breath sounds present. No wheezing,  rhonchi or rales.  Abdominal:     Palpations: Abdomen is soft.     Tenderness: There is no abdominal tenderness.  Musculoskeletal:     Right lower leg: No swelling.     Left lower leg: No swelling.  Skin:    General: Skin is warm.     Findings: No rash.  Neurological:     Mental Status: He is alert and oriented to person, place, and time.      Condition at discharge: stable  The results of significant diagnostics from this hospitalization (including imaging, microbiology, ancillary and laboratory) are listed below for reference.   Imaging Studies: DG Chest Port 1 View Result Date: 02/28/2024 EXAM: 1 VIEW XRAY OF THE CHEST 02/28/2024 09:29:00 AM COMPARISON: 02/22/2024 CLINICAL HISTORY: Questionable sepsis - evaluate for abnormality FINDINGS: LUNGS AND PLEURA: Emphysema. Chronic coarsened interstitial markings with increased superimposed patchy airspace opacity bilaterally. No pleural effusion. No pneumothorax. HEART AND MEDIASTINUM: No acute abnormality of the cardiac and mediastinal silhouettes. BONES AND SOFT TISSUES: No acute osseous abnormality. IMPRESSION: 1. Increased patchy airspace opacity bilaterally. Electronically signed by: Morgane Naveau MD 02/28/2024 11:41 AM EST  RP Workstation: [id number]   DG Chest 2 View Result Date: 02/22/2024 EXAM: 2 VIEW(S) XRAY OF THE CHEST 02/22/2024 10:57:00 AM COMPARISON: Chest CT 11/02/2023 and earlier. CLINICAL HISTORY: 63 year old male with shortness of breath, smoker, and interstitial lung disease. FINDINGS: LUNGS AND PLEURA: Hyperinflation. Chronic increased pulmonary interstitial markings diffusely. Chronic large lung volumes. Left upper lobe hazy airspace opacity, New since September CT scout view. No pleural effusion. No pneumothorax. HEART AND MEDIASTINUM: No acute abnormality of the cardiac and mediastinal silhouettes. BONES AND SOFT TISSUES: No acute osseous abnormality. IMPRESSION: 1. Chronic ILD with New indistinct Left mid lung opacity, suspicious for acute infectious exacerbation. Electronically signed by: Helayne Hurst MD 02/22/2024 12:01 PM EST RP Workstation: [id number]    Microbiology: Results for orders placed or performed during the hospital encounter of 02/28/24  Blood Culture (routine x 2)     Status: None (Preliminary result)   Collection Time: 02/28/24  9:12 AM   Specimen: BLOOD  Result Value Ref Range Status   Specimen Description BLOOD BLOOD RIGHT ARM  Final   Special Requests   Final    BOTTLES DRAWN AEROBIC AND ANAEROBIC Blood Culture adequate volume   Culture   Final    NO GROWTH 2 DAYS Performed at [facility], [address] [place], [place] [address]    Report Status PENDING  Incomplete  Resp panel by RT-PCR (RSV, Flu A&B, Covid) Anterior Nasal Swab     Status: Abnormal   Collection Time: 02/28/24  9:12 AM   Specimen: Anterior Nasal Swab  Result Value Ref Range Status   SARS Coronavirus 2 by RT PCR NEGATIVE NEGATIVE Final    Comment: (NOTE) SARS-CoV-2 target nucleic acids are NOT DETECTED.  The SARS-CoV-2 RNA is generally detectable in upper respiratory specimens during the acute phase of infection. The lowest concentration of SARS-CoV-2 viral copies this assay can detect  is 138 copies/mL. A negative result does not preclude SARS-Cov-2 infection and should not be used as the sole basis for treatment or other patient management decisions. A negative result may occur with  improper specimen collection/handling, submission of specimen other than nasopharyngeal swab, presence of viral mutation(s) within the areas targeted by this assay, and inadequate number of viral copies(<138 copies/mL). A negative result must be combined with clinical observations, patient history, and epidemiological information. The expected result is Negative.  Fact Sheet for Patients:  bloggercourse.com  Fact Sheet for Healthcare Providers:  seriousbroker.it  This test is no t yet approved or cleared by the [place]  FDA and  has been authorized for detection and/or diagnosis of SARS-CoV-2 by FDA under an Emergency Use Authorization (EUA). This EUA will remain  in effect (meaning this test can be used) for the duration of the COVID-19 declaration under Section 564(b)(1) of the Act, 21 U.S.C.section 360bbb-3(b)(1), unless the authorization is terminated  or revoked sooner.       Influenza A by PCR POSITIVE (A) NEGATIVE Final   Influenza B by PCR NEGATIVE NEGATIVE Final    Comment: (NOTE) The Xpert Xpress SARS-CoV-2/FLU/RSV plus assay is intended as an aid in the diagnosis of influenza from Nasopharyngeal swab specimens and should not be used as a sole basis for treatment. Nasal washings and aspirates are unacceptable for Xpert Xpress SARS-CoV-2/FLU/RSV testing.  Fact Sheet for Patients: bloggercourse.com  Fact Sheet for Healthcare Providers: seriousbroker.it  This test is not yet approved or cleared by the [place]  FDA and has been authorized for detection and/or diagnosis of SARS-CoV-2 by FDA under an Emergency Use Authorization (EUA). This EUA will remain in  effect (meaning this test can be used) for the duration of the COVID-19 declaration under Section 564(b)(1) of the Act, 21 U.S.C. section 360bbb-3(b)(1), unless the authorization is terminated or revoked.     Resp Syncytial Virus by PCR NEGATIVE NEGATIVE Final    Comment: (NOTE) Fact Sheet for Patients: bloggercourse.com  Fact Sheet for Healthcare Providers: seriousbroker.it  This test is not yet approved or cleared by the [place]  FDA and has been authorized for detection and/or diagnosis of SARS-CoV-2 by FDA under an Emergency Use Authorization (EUA). This EUA will remain in effect (meaning this test can be used) for the duration of the COVID-19 declaration under Section 564(b)(1) of the Act, 21 U.S.C. section 360bbb-3(b)(1), unless the authorization is terminated or revoked.  Performed at [facility], [address] [place], [place] [address]   MRSA Next Gen by PCR, Nasal     Status: None   Collection Time: 02/29/24  8:54 AM   Specimen: Nasal Mucosa; Nasal Swab  Result Value Ref Range Status   MRSA by PCR Next Gen NOT DETECTED NOT DETECTED Final    Comment: (NOTE) The GeneXpert MRSA Assay (FDA approved for NASAL specimens only), is one component of a comprehensive MRSA colonization surveillance program. It is not intended to diagnose MRSA infection nor to guide or monitor treatment for MRSA infections. Test performance is not FDA approved in patients less than 38 years old. Performed at [facility], [address] [place], [place] [address]     Labs: CBC: Recent Labs  Lab 02/28/24 0912 02/29/24 0851  WBC 17.6* 13.1*  NEUTROABS 12.8*  --   HGB 12.1* 11.3*  HCT 35.8* 33.2*  MCV 93.7 94.1  PLT 229 263   Basic Metabolic Panel: Recent Labs  Lab 02/28/24 0912  NA 131*  K 3.5  CL 95*  CO2 26  GLUCOSE 119*  BUN 8  CREATININE 0.78  CALCIUM 8.4*   Liver Function Tests: Recent  Labs  Lab 02/28/24 0912  AST 26  ALT 22  ALKPHOS 47  BILITOT 0.8  PROT 7.2  ALBUMIN 3.7   CBG: No results for input(s): GLUCAP in the last 168 hours.  Discharge time spent: greater than 30 minutes.  Signed: Charlie Patterson, MD [facility] 03/01/2024 "

## 2024-03-01 NOTE — Progress Notes (Addendum)
 SATURATION QUALIFICATIONS: (This note is used to comply with regulatory documentation for home oxygen)  Patient Saturations on Room Air at Rest = 88-89%  Patient Saturations on Room Air while Ambulating = 84%  Patient Saturations on 2 Liters of oxygen while Ambulating = 89-91% Pt Oxygen increased to 3L O2 sat sustaining at 92-93%  Please briefly explain why patient needs home oxygen:

## 2024-03-01 NOTE — Progress Notes (Signed)
 All discharge requirements met.

## 2024-03-01 NOTE — Plan of Care (Signed)

## 2024-03-01 NOTE — TOC Transition Note (Signed)
 Transition of Care [facility]) - Discharge Note   Patient Details  Name: Jared Farley MRN: [id number] Date of Birth: 06-21-1960  [facility] CM/SW Contact:  Lauraine JAYSON Carpen, LCSW Phone Number: 03/01/2024, 3:17 PM   Clinical Narrative:  Patient has orders to discharge home today. No further concerns. CSW signing off.   Final next level of care: Home/Self Care Barriers to Discharge: Barriers Resolved   Patient Goals and CMS Choice            Discharge Placement                    Patient and family notified of of transfer: 03/01/24  Discharge Plan and Services Additional resources added to the After Visit Summary for       Post Acute Care Choice: Durable Medical Equipment          DME Arranged: Oxygen DME Agency: [employer] Date DME Agency Contacted: 03/01/24   Representative spoke with at DME Agency: [employer]            Social Drivers of Health (SDOH) Interventions SDOH Screenings   Food Insecurity: Food Insecurity Present (02/28/2024)  Housing: Low Risk (02/28/2024)  Transportation Needs: No Transportation Needs (02/28/2024)  Utilities: Not At Risk (02/28/2024)  Depression (PHQ2-9): High Risk (10/28/2021)  Financial Resource Strain: Low Risk  (11/05/2023)   Received from [facility]  Recent Concern: Financial Resource Strain - Medium Risk (10/13/2023)   Received from [facility]  Tobacco Use: High Risk (02/28/2024)     Readmission Risk Interventions     No data to display

## 2024-03-01 NOTE — Assessment & Plan Note (Signed)
 Prednisone  for few more days then go back on his normal regimen.

## 2024-03-01 NOTE — Assessment & Plan Note (Signed)
 Patient met sepsis criteria with leukocytosis, tachycardia and tachypnea, secondary to pneumonia with history of recent influenza A infection. MRSA PCR negative. Patient given Rocephin  and Zithromax  while here will switch over to Omnicef and Zithromax  upon discharge.

## 2024-03-01 NOTE — Assessment & Plan Note (Signed)
 Follow-up as outpatient.  Regular diet

## 2024-03-01 NOTE — Assessment & Plan Note (Signed)
 Tested positive for influenza A, symptoms for more than 5 days now.  Out of window for Tamiflu . - Continue with supportive care

## 2024-03-01 NOTE — Assessment & Plan Note (Signed)
 Patient qualifies for home oxygen.  2 L at rest and 3 L with ambulation.  Pulse ox at room air 88 to 89% with ambulation on room air down to 84%.

## 2024-03-01 NOTE — Assessment & Plan Note (Signed)
 Continue home Flomax

## 2024-03-01 NOTE — Assessment & Plan Note (Signed)
 Continue home oxycodone  and morphine 

## 2024-03-01 NOTE — TOC Initial Note (Signed)
 Transition of Care [facility]) - Initial/Assessment Note    Patient Details  Name: Jared Farley MRN: [id number] Date of Birth: Jul 25, 1960  Transition of Care [facility]) CM/SW Contact:    Lauraine JAYSON Carpen, LCSW Phone Number: 03/01/2024, 12:38 PM  Clinical Narrative:   CSW met with patient. No family at bedside. CSW introduced role. Patient will need home oxygen at discharge. He has a concentrator that someone gave him but no tanks. No oxygen company preference. CSW ordered through [employer]. No further concerns. CSW will continue to follow patient for support and facilitate return home once stable. He will call his ride when he is discharged.               Expected Discharge Plan: Home/Self Care Barriers to Discharge: Continued Medical Work up   Patient Goals and CMS Choice            Expected Discharge Plan and Services     Post Acute Care Choice: Durable Medical Equipment Living arrangements for the past 2 months: Single Family Home                 DME Arranged: Oxygen DME Agency: [employer] Date DME Agency Contacted: 03/01/24   Representative spoke with at DME Agency: [employer]            Prior Living Arrangements/Services Living arrangements for the past 2 months: Single Family Home   Patient language and need for interpreter reviewed:: Yes Do you feel safe going back to the place where you live?: Yes      Need for Family Participation in Patient Care: Yes (Comment) Care giver support system in place?: Yes (comment)   Criminal Activity/Legal Involvement Pertinent to Current Situation/Hospitalization: No - Comment as needed  Activities of Daily Living   ADL Screening (condition at time of admission) Independently performs ADLs?: No Does the patient have a NEW difficulty with bathing/dressing/toileting/self-feeding that is expected to last >3 days?: No Does the patient have a NEW difficulty with getting in/out of bed, walking, or climbing stairs that is expected to last >3  days?: No Does the patient have a NEW difficulty with communication that is expected to last >3 days?: No Is the patient deaf or have difficulty hearing?: No Does the patient have difficulty seeing, even when wearing glasses/contacts?: No Does the patient have difficulty concentrating, remembering, or making decisions?: No  Permission Sought/Granted Permission sought to share information with : Facility Industrial/product Designer granted to share information with : Yes, Verbal Permission Granted     Permission granted to share info w AGENCY: Oxygen companies        Emotional Assessment Appearance:: Appears stated age Attitude/Demeanor/Rapport: Engaged, Gracious Affect (typically observed): Accepting, Appropriate, Calm, Pleasant Orientation: : Oriented to Self, Oriented to Place, Oriented to  Time, Oriented to Situation Alcohol / Substance Use: Not Applicable Psych Involvement: No (comment)  Admission diagnosis:  Shortness of breath [R06.02] Hypoxia [R09.02] COPD exacerbation (HCC) [J44.1] BPH with obstruction/lower urinary tract symptoms [N40.1, N13.8] PNA (pneumonia) [J18.9] Influenza [J11.1] Community acquired pneumonia, unspecified laterality [J18.9] Patient Active Problem List   Diagnosis Date Noted   Acute on chronic hypoxic respiratory failure (HCC) 02/29/2024   Smoking addiction 02/29/2024   CAP (community acquired pneumonia) 02/28/2024   Influenza A 02/28/2024   Sepsis due to pneumonia (HCC) 02/28/2024   Acquired trigger finger of left middle finger 02/24/2018   Low back pain 10/10/2015   Orchalgia 06/12/2015   Routine general medical examination at a health care facility  05/17/2015   Special screening for malignant neoplasms, colon 05/17/2015   Medicare annual wellness visit, subsequent 02/26/2014   PTSD (post-traumatic stress disorder) 02/26/2014   Screening for colon cancer 02/26/2014   Incomplete emptying of bladder 12/13/2013   Microscopic hematuria  12/13/2013   Prostatitis, chronic 12/13/2013   BPH (benign prostatic hyperplasia) 11/27/2013   Insomnia 11/27/2013   COPD with acute exacerbation (HCC) 01/04/2013   Tobacco abuse counseling 01/04/2013   Temperature intolerance 05/04/2012   Hypogonadism male 12/18/2010   Chronic pain syndrome 12/18/2010   PCP:  Sherial Bail, MD Pharmacy:   [facility] - [place], [place] - [address] [place] [address] Phone: [phone] Fax: [phone]  [facility] [address]), [place] - [address] [place]) [place] [address] Phone: [phone] Fax: [phone]     Social Drivers of Health (SDOH) Social History: SDOH Screenings   Food Insecurity: Food Insecurity Present (02/28/2024)  Housing: Low Risk (02/28/2024)  Transportation Needs: No Transportation Needs (02/28/2024)  Utilities: Not At Risk (02/28/2024)  Depression (PHQ2-9): High Risk (10/28/2021)  Financial Resource Strain: Low Risk  (11/05/2023)   Received from [facility]  Recent Concern: Financial Resource Strain - Medium Risk (10/13/2023)   Received from [facility]  Tobacco Use: High Risk (02/28/2024)   SDOH Interventions:     Readmission Risk Interventions     No data to display

## 2024-03-04 LAB — CULTURE, BLOOD (ROUTINE X 2)
Culture: NO GROWTH
Special Requests: ADEQUATE

## 2024-03-06 LAB — CULTURE, BLOOD (ROUTINE X 2)
Culture: NO GROWTH
Special Requests: ADEQUATE
